# Patient Record
Sex: Female | Born: 1935 | Race: White | Hispanic: No | Smoking: Never smoker
Health system: Southern US, Community
[De-identification: ages and names within clinical notes are randomized; demographics above are authoritative.]

## PROBLEM LIST (undated history)

## (undated) DIAGNOSIS — M1990 Unspecified osteoarthritis, unspecified site: Secondary | ICD-10-CM

## (undated) DIAGNOSIS — I10 Essential (primary) hypertension: Secondary | ICD-10-CM

## (undated) DIAGNOSIS — K219 Gastro-esophageal reflux disease without esophagitis: Secondary | ICD-10-CM

## (undated) DIAGNOSIS — F419 Anxiety disorder, unspecified: Secondary | ICD-10-CM

## (undated) DIAGNOSIS — H04123 Dry eye syndrome of bilateral lacrimal glands: Secondary | ICD-10-CM

## (undated) DIAGNOSIS — H01009 Unspecified blepharitis unspecified eye, unspecified eyelid: Secondary | ICD-10-CM

## (undated) DIAGNOSIS — H269 Unspecified cataract: Secondary | ICD-10-CM

## (undated) HISTORY — PX: CATARACT EXTRACTION: SUR2

---

## 1997-12-06 ENCOUNTER — Ambulatory Visit (HOSPITAL_COMMUNITY): Admission: RE | Admit: 1997-12-06 | Discharge: 1997-12-06 | Payer: Self-pay | Admitting: Obstetrics & Gynecology

## 1998-01-18 ENCOUNTER — Ambulatory Visit (HOSPITAL_COMMUNITY): Admission: RE | Admit: 1998-01-18 | Discharge: 1998-01-18 | Payer: Self-pay | Admitting: Internal Medicine

## 1999-09-04 ENCOUNTER — Encounter: Payer: Self-pay | Admitting: Internal Medicine

## 1999-09-04 ENCOUNTER — Encounter: Admission: RE | Admit: 1999-09-04 | Discharge: 1999-09-04 | Payer: Self-pay | Admitting: Internal Medicine

## 2000-09-05 ENCOUNTER — Encounter: Payer: Self-pay | Admitting: Internal Medicine

## 2000-09-05 ENCOUNTER — Encounter: Admission: RE | Admit: 2000-09-05 | Discharge: 2000-09-05 | Payer: Self-pay | Admitting: Internal Medicine

## 2000-10-03 ENCOUNTER — Other Ambulatory Visit: Admission: RE | Admit: 2000-10-03 | Discharge: 2000-10-03 | Payer: Self-pay | Admitting: Internal Medicine

## 2001-08-06 ENCOUNTER — Encounter: Payer: Self-pay | Admitting: Gastroenterology

## 2001-08-06 ENCOUNTER — Encounter: Admission: RE | Admit: 2001-08-06 | Discharge: 2001-08-06 | Payer: Self-pay | Admitting: Gastroenterology

## 2001-09-07 ENCOUNTER — Encounter: Admission: RE | Admit: 2001-09-07 | Discharge: 2001-09-07 | Payer: Self-pay | Admitting: *Deleted

## 2001-09-07 ENCOUNTER — Encounter: Payer: Self-pay | Admitting: *Deleted

## 2001-11-04 ENCOUNTER — Other Ambulatory Visit: Admission: RE | Admit: 2001-11-04 | Discharge: 2001-11-04 | Payer: Self-pay | Admitting: Internal Medicine

## 2002-09-20 ENCOUNTER — Encounter: Payer: Self-pay | Admitting: Internal Medicine

## 2002-09-20 ENCOUNTER — Encounter: Admission: RE | Admit: 2002-09-20 | Discharge: 2002-09-20 | Payer: Self-pay | Admitting: Internal Medicine

## 2003-09-28 ENCOUNTER — Encounter: Admission: RE | Admit: 2003-09-28 | Discharge: 2003-09-28 | Payer: Self-pay | Admitting: Internal Medicine

## 2004-01-26 ENCOUNTER — Encounter: Admission: RE | Admit: 2004-01-26 | Discharge: 2004-01-26 | Payer: Self-pay | Admitting: Internal Medicine

## 2004-07-04 ENCOUNTER — Ambulatory Visit (HOSPITAL_COMMUNITY): Admission: RE | Admit: 2004-07-04 | Discharge: 2004-07-04 | Payer: Self-pay | Admitting: Gastroenterology

## 2004-10-03 ENCOUNTER — Ambulatory Visit (HOSPITAL_COMMUNITY): Admission: RE | Admit: 2004-10-03 | Discharge: 2004-10-03 | Payer: Self-pay | Admitting: Internal Medicine

## 2005-02-15 ENCOUNTER — Other Ambulatory Visit: Admission: RE | Admit: 2005-02-15 | Discharge: 2005-02-15 | Payer: Self-pay | Admitting: Internal Medicine

## 2005-10-09 ENCOUNTER — Ambulatory Visit (HOSPITAL_COMMUNITY): Admission: RE | Admit: 2005-10-09 | Discharge: 2005-10-09 | Payer: Self-pay | Admitting: Internal Medicine

## 2006-10-15 ENCOUNTER — Encounter: Admission: RE | Admit: 2006-10-15 | Discharge: 2006-10-15 | Payer: Self-pay | Admitting: Internal Medicine

## 2007-10-21 ENCOUNTER — Encounter: Admission: RE | Admit: 2007-10-21 | Discharge: 2007-10-21 | Payer: Self-pay | Admitting: Internal Medicine

## 2008-10-26 ENCOUNTER — Encounter: Admission: RE | Admit: 2008-10-26 | Discharge: 2008-10-26 | Payer: Self-pay | Admitting: Internal Medicine

## 2009-11-01 ENCOUNTER — Encounter: Admission: RE | Admit: 2009-11-01 | Discharge: 2009-11-01 | Payer: Self-pay | Admitting: Internal Medicine

## 2010-10-08 ENCOUNTER — Encounter
Admission: RE | Admit: 2010-10-08 | Discharge: 2010-10-08 | Payer: Self-pay | Source: Home / Self Care | Attending: Internal Medicine | Admitting: Internal Medicine

## 2010-11-14 ENCOUNTER — Other Ambulatory Visit: Payer: Self-pay | Admitting: Internal Medicine

## 2010-11-14 ENCOUNTER — Ambulatory Visit
Admission: RE | Admit: 2010-11-14 | Discharge: 2010-11-14 | Disposition: A | Discharge status: Home / Self Care | Payer: Federal, State, Local not specified - PPO | Source: Ambulatory Visit | Attending: Internal Medicine | Admitting: Internal Medicine

## 2010-11-14 DIAGNOSIS — Z1231 Encounter for screening mammogram for malignant neoplasm of breast: Secondary | ICD-10-CM

## 2011-02-01 NOTE — Op Note (Signed)
NAME:  Selena Farmer, Selena Farmer                 ACCOUNT NO.:  [id number]   MEDICAL RECORD NO.:  [id number]          PATIENT TYPE:  AMB   LOCATION:  ENDO                         FACILITY:  [facility] [place]   PHYSICIAN:  Danise Edge, M.D.   DATE OF BIRTH:  Jan 26, 1924   DATE OF PROCEDURE:  07/04/2004  DATE OF DISCHARGE:                                 OPERATIVE REPORT   PROCEDURE:  Screening colonoscopy.   PROCEDURE INDICATION:  Selena Farmer is a 77 year old female born 1923/09/18.  Selena Farmer is scheduled to undergo her first screening colonoscopy  with polypectomy to prevent colon cancer.   ENDOSCOPIST:  Danise Edge, M.D.   PREMEDICATION:  Versed 6 mg, Demerol 60 mg.   PROCEDURE:  After obtaining informed consent, Selena Farmer was placed in the  left lateral decubitus position.  I administered intravenous Demerol and  intravenous Versed to achieve conscious sedation for the procedure.  The  patient's blood pressure, oxygen saturation, and cardiac rhythm were  monitored throughout the procedure and documented in the medical record.   Anal inspection and digital rectal exam were normal.  The Olympus adjustable  pediatric colonoscope was introduced into the rectum and advanced to the  cecum.  Colonic preparation for the exam today was excellent.   Rectum normal.   Sigmoid colon and descending colon normal.   Splenic flexure normal.   Transverse colon normal.   Hepatic flexure normal.   Ascending colon normal.   Cecum and ileocecal valve normal.   ASSESSMENT:  Normal screening proctocolonoscopy to the cecum.  No endoscopic  evidence for the presence of colorectal neoplasia.      MJ/MEDQ  D:  07/04/2004  T:  07/04/2004  Job:  [id number]   cc:   Georgann Housekeeper, MD  [address]  [place]  [place] [address]  Fax: [phone]

## 2011-10-21 DIAGNOSIS — T1510XA Foreign body in conjunctival sac, unspecified eye, initial encounter: Secondary | ICD-10-CM | POA: Diagnosis not present

## 2011-10-21 DIAGNOSIS — H01009 Unspecified blepharitis unspecified eye, unspecified eyelid: Secondary | ICD-10-CM | POA: Diagnosis not present

## 2011-12-09 ENCOUNTER — Other Ambulatory Visit: Payer: Self-pay | Admitting: Internal Medicine

## 2011-12-09 DIAGNOSIS — Z1231 Encounter for screening mammogram for malignant neoplasm of breast: Secondary | ICD-10-CM

## 2012-01-01 ENCOUNTER — Ambulatory Visit
Admission: RE | Admit: 2012-01-01 | Discharge: 2012-01-01 | Disposition: A | Discharge status: Home / Self Care | Payer: Federal, State, Local not specified - PPO | Source: Ambulatory Visit | Attending: Internal Medicine | Admitting: Internal Medicine

## 2012-01-01 DIAGNOSIS — Z1231 Encounter for screening mammogram for malignant neoplasm of breast: Secondary | ICD-10-CM

## 2012-01-02 DIAGNOSIS — Z961 Presence of intraocular lens: Secondary | ICD-10-CM | POA: Diagnosis not present

## 2012-01-02 DIAGNOSIS — H353190 Nonexudative age-related macular degeneration, unspecified eye, stage unspecified: Secondary | ICD-10-CM | POA: Diagnosis not present

## 2012-06-10 DIAGNOSIS — H01009 Unspecified blepharitis unspecified eye, unspecified eyelid: Secondary | ICD-10-CM | POA: Diagnosis not present

## 2012-06-24 DIAGNOSIS — Z0000 Encounter for general adult medical examination without abnormal findings: Secondary | ICD-10-CM | POA: Diagnosis not present

## 2012-06-24 DIAGNOSIS — R7309 Other abnormal glucose: Secondary | ICD-10-CM | POA: Diagnosis not present

## 2012-06-24 DIAGNOSIS — D649 Anemia, unspecified: Secondary | ICD-10-CM | POA: Diagnosis not present

## 2012-06-24 DIAGNOSIS — I10 Essential (primary) hypertension: Secondary | ICD-10-CM | POA: Diagnosis not present

## 2012-06-24 DIAGNOSIS — E782 Mixed hyperlipidemia: Secondary | ICD-10-CM | POA: Diagnosis not present

## 2012-06-24 DIAGNOSIS — K219 Gastro-esophageal reflux disease without esophagitis: Secondary | ICD-10-CM | POA: Diagnosis not present

## 2012-06-24 DIAGNOSIS — Z23 Encounter for immunization: Secondary | ICD-10-CM | POA: Diagnosis not present

## 2012-06-24 DIAGNOSIS — M810 Age-related osteoporosis without current pathological fracture: Secondary | ICD-10-CM | POA: Diagnosis not present

## 2012-06-24 DIAGNOSIS — Z1331 Encounter for screening for depression: Secondary | ICD-10-CM | POA: Diagnosis not present

## 2012-08-06 DIAGNOSIS — Z961 Presence of intraocular lens: Secondary | ICD-10-CM | POA: Diagnosis not present

## 2012-08-06 DIAGNOSIS — H04129 Dry eye syndrome of unspecified lacrimal gland: Secondary | ICD-10-CM | POA: Diagnosis not present

## 2012-08-06 DIAGNOSIS — H353190 Nonexudative age-related macular degeneration, unspecified eye, stage unspecified: Secondary | ICD-10-CM | POA: Diagnosis not present

## 2012-08-06 DIAGNOSIS — H01009 Unspecified blepharitis unspecified eye, unspecified eyelid: Secondary | ICD-10-CM | POA: Diagnosis not present

## 2012-08-19 DIAGNOSIS — M810 Age-related osteoporosis without current pathological fracture: Secondary | ICD-10-CM | POA: Diagnosis not present

## 2012-11-23 DIAGNOSIS — I10 Essential (primary) hypertension: Secondary | ICD-10-CM | POA: Diagnosis not present

## 2012-11-23 DIAGNOSIS — K589 Irritable bowel syndrome without diarrhea: Secondary | ICD-10-CM | POA: Diagnosis not present

## 2012-11-23 DIAGNOSIS — K219 Gastro-esophageal reflux disease without esophagitis: Secondary | ICD-10-CM | POA: Diagnosis not present

## 2012-12-22 ENCOUNTER — Other Ambulatory Visit: Payer: Self-pay

## 2012-12-22 DIAGNOSIS — Z1231 Encounter for screening mammogram for malignant neoplasm of breast: Secondary | ICD-10-CM

## 2013-01-26 DIAGNOSIS — R040 Epistaxis: Secondary | ICD-10-CM | POA: Diagnosis not present

## 2013-02-03 ENCOUNTER — Ambulatory Visit
Admission: RE | Admit: 2013-02-03 | Discharge: 2013-02-03 | Disposition: A | Discharge status: Home / Self Care | Payer: Medicare Other | Source: Ambulatory Visit

## 2013-02-03 DIAGNOSIS — Z1231 Encounter for screening mammogram for malignant neoplasm of breast: Secondary | ICD-10-CM

## 2013-02-05 ENCOUNTER — Other Ambulatory Visit: Payer: Self-pay

## 2013-02-05 DIAGNOSIS — Z1231 Encounter for screening mammogram for malignant neoplasm of breast: Secondary | ICD-10-CM

## 2013-02-17 DIAGNOSIS — H43819 Vitreous degeneration, unspecified eye: Secondary | ICD-10-CM | POA: Diagnosis not present

## 2013-02-17 DIAGNOSIS — H3530 Unspecified macular degeneration: Secondary | ICD-10-CM | POA: Diagnosis not present

## 2013-02-17 DIAGNOSIS — Z961 Presence of intraocular lens: Secondary | ICD-10-CM | POA: Diagnosis not present

## 2013-06-30 DIAGNOSIS — R7309 Other abnormal glucose: Secondary | ICD-10-CM | POA: Diagnosis not present

## 2013-06-30 DIAGNOSIS — R209 Unspecified disturbances of skin sensation: Secondary | ICD-10-CM | POA: Diagnosis not present

## 2013-06-30 DIAGNOSIS — Z1331 Encounter for screening for depression: Secondary | ICD-10-CM | POA: Diagnosis not present

## 2013-06-30 DIAGNOSIS — I10 Essential (primary) hypertension: Secondary | ICD-10-CM | POA: Diagnosis not present

## 2013-06-30 DIAGNOSIS — Z0000 Encounter for general adult medical examination without abnormal findings: Secondary | ICD-10-CM | POA: Diagnosis not present

## 2013-06-30 DIAGNOSIS — D510 Vitamin B12 deficiency anemia due to intrinsic factor deficiency: Secondary | ICD-10-CM | POA: Diagnosis not present

## 2013-06-30 DIAGNOSIS — E782 Mixed hyperlipidemia: Secondary | ICD-10-CM | POA: Diagnosis not present

## 2013-06-30 DIAGNOSIS — K219 Gastro-esophageal reflux disease without esophagitis: Secondary | ICD-10-CM | POA: Diagnosis not present

## 2013-07-14 DIAGNOSIS — G5600 Carpal tunnel syndrome, unspecified upper limb: Secondary | ICD-10-CM | POA: Diagnosis not present

## 2013-09-21 DIAGNOSIS — J209 Acute bronchitis, unspecified: Secondary | ICD-10-CM | POA: Diagnosis not present

## 2013-09-23 DIAGNOSIS — H04129 Dry eye syndrome of unspecified lacrimal gland: Secondary | ICD-10-CM | POA: Diagnosis not present

## 2013-09-23 DIAGNOSIS — Z961 Presence of intraocular lens: Secondary | ICD-10-CM | POA: Diagnosis not present

## 2013-09-23 DIAGNOSIS — H01009 Unspecified blepharitis unspecified eye, unspecified eyelid: Secondary | ICD-10-CM | POA: Diagnosis not present

## 2013-09-23 DIAGNOSIS — H353190 Nonexudative age-related macular degeneration, unspecified eye, stage unspecified: Secondary | ICD-10-CM | POA: Diagnosis not present

## 2014-01-03 DIAGNOSIS — F411 Generalized anxiety disorder: Secondary | ICD-10-CM | POA: Diagnosis not present

## 2014-01-03 DIAGNOSIS — I10 Essential (primary) hypertension: Secondary | ICD-10-CM | POA: Diagnosis not present

## 2014-01-03 DIAGNOSIS — G5600 Carpal tunnel syndrome, unspecified upper limb: Secondary | ICD-10-CM | POA: Diagnosis not present

## 2014-01-03 DIAGNOSIS — K219 Gastro-esophageal reflux disease without esophagitis: Secondary | ICD-10-CM | POA: Diagnosis not present

## 2014-01-03 DIAGNOSIS — D649 Anemia, unspecified: Secondary | ICD-10-CM | POA: Diagnosis not present

## 2014-01-19 DIAGNOSIS — G5600 Carpal tunnel syndrome, unspecified upper limb: Secondary | ICD-10-CM | POA: Diagnosis not present

## 2014-02-09 ENCOUNTER — Ambulatory Visit: Payer: Medicare Other

## 2014-03-21 DIAGNOSIS — K625 Hemorrhage of anus and rectum: Secondary | ICD-10-CM | POA: Diagnosis not present

## 2014-07-05 DIAGNOSIS — Z13220 Encounter for screening for lipoid disorders: Secondary | ICD-10-CM | POA: Diagnosis not present

## 2014-07-05 DIAGNOSIS — Z23 Encounter for immunization: Secondary | ICD-10-CM | POA: Diagnosis not present

## 2014-07-05 DIAGNOSIS — I10 Essential (primary) hypertension: Secondary | ICD-10-CM | POA: Diagnosis not present

## 2014-07-05 DIAGNOSIS — J309 Allergic rhinitis, unspecified: Secondary | ICD-10-CM | POA: Diagnosis not present

## 2014-07-05 DIAGNOSIS — Z0000 Encounter for general adult medical examination without abnormal findings: Secondary | ICD-10-CM | POA: Diagnosis not present

## 2014-07-05 DIAGNOSIS — D510 Vitamin B12 deficiency anemia due to intrinsic factor deficiency: Secondary | ICD-10-CM | POA: Diagnosis not present

## 2014-07-05 DIAGNOSIS — R7309 Other abnormal glucose: Secondary | ICD-10-CM | POA: Diagnosis not present

## 2014-07-05 DIAGNOSIS — M810 Age-related osteoporosis without current pathological fracture: Secondary | ICD-10-CM | POA: Diagnosis not present

## 2014-07-05 DIAGNOSIS — K219 Gastro-esophageal reflux disease without esophagitis: Secondary | ICD-10-CM | POA: Diagnosis not present

## 2014-07-05 DIAGNOSIS — F419 Anxiety disorder, unspecified: Secondary | ICD-10-CM | POA: Diagnosis not present

## 2014-07-05 DIAGNOSIS — Z1389 Encounter for screening for other disorder: Secondary | ICD-10-CM | POA: Diagnosis not present

## 2014-07-18 DIAGNOSIS — Z1211 Encounter for screening for malignant neoplasm of colon: Secondary | ICD-10-CM | POA: Diagnosis not present

## 2014-07-18 DIAGNOSIS — Z0000 Encounter for general adult medical examination without abnormal findings: Secondary | ICD-10-CM | POA: Diagnosis not present

## 2014-09-02 DIAGNOSIS — L570 Actinic keratosis: Secondary | ICD-10-CM | POA: Diagnosis not present

## 2014-09-02 DIAGNOSIS — L578 Other skin changes due to chronic exposure to nonionizing radiation: Secondary | ICD-10-CM | POA: Diagnosis not present

## 2014-09-02 DIAGNOSIS — L820 Inflamed seborrheic keratosis: Secondary | ICD-10-CM | POA: Diagnosis not present

## 2014-10-06 DIAGNOSIS — H01009 Unspecified blepharitis unspecified eye, unspecified eyelid: Secondary | ICD-10-CM | POA: Diagnosis not present

## 2014-10-06 DIAGNOSIS — Z961 Presence of intraocular lens: Secondary | ICD-10-CM | POA: Diagnosis not present

## 2014-10-06 DIAGNOSIS — H04123 Dry eye syndrome of bilateral lacrimal glands: Secondary | ICD-10-CM | POA: Diagnosis not present

## 2014-10-06 DIAGNOSIS — H3531 Nonexudative age-related macular degeneration: Secondary | ICD-10-CM | POA: Diagnosis not present

## 2015-01-10 DIAGNOSIS — Z1389 Encounter for screening for other disorder: Secondary | ICD-10-CM | POA: Diagnosis not present

## 2015-01-10 DIAGNOSIS — G4700 Insomnia, unspecified: Secondary | ICD-10-CM | POA: Diagnosis not present

## 2015-01-10 DIAGNOSIS — I10 Essential (primary) hypertension: Secondary | ICD-10-CM | POA: Diagnosis not present

## 2015-01-10 DIAGNOSIS — F419 Anxiety disorder, unspecified: Secondary | ICD-10-CM | POA: Diagnosis not present

## 2015-01-10 DIAGNOSIS — M25512 Pain in left shoulder: Secondary | ICD-10-CM | POA: Diagnosis not present

## 2015-01-10 DIAGNOSIS — K219 Gastro-esophageal reflux disease without esophagitis: Secondary | ICD-10-CM | POA: Diagnosis not present

## 2015-05-09 DIAGNOSIS — S76312A Strain of muscle, fascia and tendon of the posterior muscle group at thigh level, left thigh, initial encounter: Secondary | ICD-10-CM | POA: Diagnosis not present

## 2015-05-09 DIAGNOSIS — M5442 Lumbago with sciatica, left side: Secondary | ICD-10-CM | POA: Diagnosis not present

## 2015-05-12 DIAGNOSIS — M5432 Sciatica, left side: Secondary | ICD-10-CM | POA: Diagnosis not present

## 2015-05-12 DIAGNOSIS — M5442 Lumbago with sciatica, left side: Secondary | ICD-10-CM | POA: Diagnosis not present

## 2015-05-15 DIAGNOSIS — M5432 Sciatica, left side: Secondary | ICD-10-CM | POA: Diagnosis not present

## 2015-05-15 DIAGNOSIS — M5442 Lumbago with sciatica, left side: Secondary | ICD-10-CM | POA: Diagnosis not present

## 2015-05-17 DIAGNOSIS — M5442 Lumbago with sciatica, left side: Secondary | ICD-10-CM | POA: Diagnosis not present

## 2015-05-17 DIAGNOSIS — M5432 Sciatica, left side: Secondary | ICD-10-CM | POA: Diagnosis not present

## 2015-05-22 DIAGNOSIS — M5432 Sciatica, left side: Secondary | ICD-10-CM | POA: Diagnosis not present

## 2015-05-22 DIAGNOSIS — M5442 Lumbago with sciatica, left side: Secondary | ICD-10-CM | POA: Diagnosis not present

## 2015-05-24 DIAGNOSIS — M5442 Lumbago with sciatica, left side: Secondary | ICD-10-CM | POA: Diagnosis not present

## 2015-05-24 DIAGNOSIS — M5432 Sciatica, left side: Secondary | ICD-10-CM | POA: Diagnosis not present

## 2015-05-29 DIAGNOSIS — M5432 Sciatica, left side: Secondary | ICD-10-CM | POA: Diagnosis not present

## 2015-05-29 DIAGNOSIS — M5442 Lumbago with sciatica, left side: Secondary | ICD-10-CM | POA: Diagnosis not present

## 2015-06-05 DIAGNOSIS — M5432 Sciatica, left side: Secondary | ICD-10-CM | POA: Diagnosis not present

## 2015-06-05 DIAGNOSIS — M5442 Lumbago with sciatica, left side: Secondary | ICD-10-CM | POA: Diagnosis not present

## 2015-07-20 DIAGNOSIS — Z1389 Encounter for screening for other disorder: Secondary | ICD-10-CM | POA: Diagnosis not present

## 2015-07-20 DIAGNOSIS — Z0000 Encounter for general adult medical examination without abnormal findings: Secondary | ICD-10-CM | POA: Diagnosis not present

## 2015-07-20 DIAGNOSIS — J309 Allergic rhinitis, unspecified: Secondary | ICD-10-CM | POA: Diagnosis not present

## 2015-07-20 DIAGNOSIS — Z23 Encounter for immunization: Secondary | ICD-10-CM | POA: Diagnosis not present

## 2015-07-20 DIAGNOSIS — G4700 Insomnia, unspecified: Secondary | ICD-10-CM | POA: Diagnosis not present

## 2015-07-20 DIAGNOSIS — F419 Anxiety disorder, unspecified: Secondary | ICD-10-CM | POA: Diagnosis not present

## 2015-07-20 DIAGNOSIS — M810 Age-related osteoporosis without current pathological fracture: Secondary | ICD-10-CM | POA: Diagnosis not present

## 2015-07-20 DIAGNOSIS — R7309 Other abnormal glucose: Secondary | ICD-10-CM | POA: Diagnosis not present

## 2015-07-20 DIAGNOSIS — D510 Vitamin B12 deficiency anemia due to intrinsic factor deficiency: Secondary | ICD-10-CM | POA: Diagnosis not present

## 2015-07-20 DIAGNOSIS — I10 Essential (primary) hypertension: Secondary | ICD-10-CM | POA: Diagnosis not present

## 2015-07-20 DIAGNOSIS — K219 Gastro-esophageal reflux disease without esophagitis: Secondary | ICD-10-CM | POA: Diagnosis not present

## 2015-08-04 DIAGNOSIS — L814 Other melanin hyperpigmentation: Secondary | ICD-10-CM | POA: Diagnosis not present

## 2015-08-04 DIAGNOSIS — L578 Other skin changes due to chronic exposure to nonionizing radiation: Secondary | ICD-10-CM | POA: Diagnosis not present

## 2015-08-04 DIAGNOSIS — M7981 Nontraumatic hematoma of soft tissue: Secondary | ICD-10-CM | POA: Diagnosis not present

## 2015-10-23 DIAGNOSIS — H353132 Nonexudative age-related macular degeneration, bilateral, intermediate dry stage: Secondary | ICD-10-CM | POA: Diagnosis not present

## 2015-10-23 DIAGNOSIS — H04123 Dry eye syndrome of bilateral lacrimal glands: Secondary | ICD-10-CM | POA: Diagnosis not present

## 2015-10-23 DIAGNOSIS — Z961 Presence of intraocular lens: Secondary | ICD-10-CM | POA: Diagnosis not present

## 2015-10-23 DIAGNOSIS — Z0100 Encounter for examination of eyes and vision without abnormal findings: Secondary | ICD-10-CM | POA: Diagnosis not present

## 2015-12-12 DIAGNOSIS — M4316 Spondylolisthesis, lumbar region: Secondary | ICD-10-CM | POA: Diagnosis not present

## 2015-12-12 DIAGNOSIS — M5416 Radiculopathy, lumbar region: Secondary | ICD-10-CM | POA: Diagnosis not present

## 2015-12-18 DIAGNOSIS — M5416 Radiculopathy, lumbar region: Secondary | ICD-10-CM | POA: Diagnosis not present

## 2015-12-18 DIAGNOSIS — M4316 Spondylolisthesis, lumbar region: Secondary | ICD-10-CM | POA: Diagnosis not present

## 2016-01-09 DIAGNOSIS — M5416 Radiculopathy, lumbar region: Secondary | ICD-10-CM | POA: Diagnosis not present

## 2016-01-09 DIAGNOSIS — M4316 Spondylolisthesis, lumbar region: Secondary | ICD-10-CM | POA: Diagnosis not present

## 2016-01-25 DIAGNOSIS — J309 Allergic rhinitis, unspecified: Secondary | ICD-10-CM | POA: Diagnosis not present

## 2016-01-25 DIAGNOSIS — I10 Essential (primary) hypertension: Secondary | ICD-10-CM | POA: Diagnosis not present

## 2016-01-25 DIAGNOSIS — K219 Gastro-esophageal reflux disease without esophagitis: Secondary | ICD-10-CM | POA: Diagnosis not present

## 2016-01-25 DIAGNOSIS — K589 Irritable bowel syndrome without diarrhea: Secondary | ICD-10-CM | POA: Diagnosis not present

## 2016-01-25 DIAGNOSIS — F419 Anxiety disorder, unspecified: Secondary | ICD-10-CM | POA: Diagnosis not present

## 2016-06-05 DIAGNOSIS — Z23 Encounter for immunization: Secondary | ICD-10-CM | POA: Diagnosis not present

## 2016-08-01 DIAGNOSIS — M810 Age-related osteoporosis without current pathological fracture: Secondary | ICD-10-CM | POA: Diagnosis not present

## 2016-08-01 DIAGNOSIS — R7309 Other abnormal glucose: Secondary | ICD-10-CM | POA: Diagnosis not present

## 2016-08-01 DIAGNOSIS — D510 Vitamin B12 deficiency anemia due to intrinsic factor deficiency: Secondary | ICD-10-CM | POA: Diagnosis not present

## 2016-08-01 DIAGNOSIS — F419 Anxiety disorder, unspecified: Secondary | ICD-10-CM | POA: Diagnosis not present

## 2016-08-01 DIAGNOSIS — J309 Allergic rhinitis, unspecified: Secondary | ICD-10-CM | POA: Diagnosis not present

## 2016-08-01 DIAGNOSIS — K219 Gastro-esophageal reflux disease without esophagitis: Secondary | ICD-10-CM | POA: Diagnosis not present

## 2016-08-01 DIAGNOSIS — K589 Irritable bowel syndrome without diarrhea: Secondary | ICD-10-CM | POA: Diagnosis not present

## 2016-08-01 DIAGNOSIS — Z0000 Encounter for general adult medical examination without abnormal findings: Secondary | ICD-10-CM | POA: Diagnosis not present

## 2016-08-01 DIAGNOSIS — I10 Essential (primary) hypertension: Secondary | ICD-10-CM | POA: Diagnosis not present

## 2016-08-01 DIAGNOSIS — E7800 Pure hypercholesterolemia, unspecified: Secondary | ICD-10-CM | POA: Diagnosis not present

## 2016-08-01 DIAGNOSIS — Z1389 Encounter for screening for other disorder: Secondary | ICD-10-CM | POA: Diagnosis not present

## 2016-08-01 DIAGNOSIS — G4700 Insomnia, unspecified: Secondary | ICD-10-CM | POA: Diagnosis not present

## 2016-08-06 DIAGNOSIS — Z1211 Encounter for screening for malignant neoplasm of colon: Secondary | ICD-10-CM | POA: Diagnosis not present

## 2016-09-25 DIAGNOSIS — M25531 Pain in right wrist: Secondary | ICD-10-CM | POA: Diagnosis not present

## 2016-09-25 DIAGNOSIS — G5601 Carpal tunnel syndrome, right upper limb: Secondary | ICD-10-CM | POA: Diagnosis not present

## 2016-09-25 DIAGNOSIS — M47812 Spondylosis without myelopathy or radiculopathy, cervical region: Secondary | ICD-10-CM | POA: Diagnosis not present

## 2016-10-08 DIAGNOSIS — G589 Mononeuropathy, unspecified: Secondary | ICD-10-CM | POA: Diagnosis not present

## 2016-10-08 DIAGNOSIS — G5603 Carpal tunnel syndrome, bilateral upper limbs: Secondary | ICD-10-CM | POA: Diagnosis not present

## 2016-10-08 DIAGNOSIS — G5621 Lesion of ulnar nerve, right upper limb: Secondary | ICD-10-CM | POA: Diagnosis not present

## 2016-10-08 DIAGNOSIS — G5601 Carpal tunnel syndrome, right upper limb: Secondary | ICD-10-CM | POA: Diagnosis not present

## 2016-10-09 ENCOUNTER — Other Ambulatory Visit: Payer: Self-pay | Admitting: Orthopedic Surgery

## 2016-10-29 ENCOUNTER — Encounter (HOSPITAL_BASED_OUTPATIENT_CLINIC_OR_DEPARTMENT_OTHER): Payer: Self-pay | Admitting: *Deleted

## 2016-10-31 ENCOUNTER — Ambulatory Visit (HOSPITAL_BASED_OUTPATIENT_CLINIC_OR_DEPARTMENT_OTHER): Payer: Medicare Other | Admitting: Anesthesiology

## 2016-10-31 ENCOUNTER — Encounter (HOSPITAL_BASED_OUTPATIENT_CLINIC_OR_DEPARTMENT_OTHER): Payer: Self-pay

## 2016-10-31 ENCOUNTER — Encounter (HOSPITAL_BASED_OUTPATIENT_CLINIC_OR_DEPARTMENT_OTHER)
Admission: RE | Disposition: A | Discharge status: Home / Self Care | Payer: Self-pay | Source: Ambulatory Visit | Attending: Orthopedic Surgery

## 2016-10-31 ENCOUNTER — Ambulatory Visit (HOSPITAL_BASED_OUTPATIENT_CLINIC_OR_DEPARTMENT_OTHER)
Admission: RE | Admit: 2016-10-31 | Discharge: 2016-10-31 | Disposition: A | Discharge status: Home / Self Care | Payer: Medicare Other | Source: Ambulatory Visit | Attending: Orthopedic Surgery | Admitting: Orthopedic Surgery

## 2016-10-31 DIAGNOSIS — K219 Gastro-esophageal reflux disease without esophagitis: Secondary | ICD-10-CM | POA: Insufficient documentation

## 2016-10-31 DIAGNOSIS — F419 Anxiety disorder, unspecified: Secondary | ICD-10-CM | POA: Insufficient documentation

## 2016-10-31 DIAGNOSIS — Z79899 Other long term (current) drug therapy: Secondary | ICD-10-CM | POA: Diagnosis not present

## 2016-10-31 DIAGNOSIS — G5601 Carpal tunnel syndrome, right upper limb: Secondary | ICD-10-CM | POA: Diagnosis not present

## 2016-10-31 DIAGNOSIS — I10 Essential (primary) hypertension: Secondary | ICD-10-CM | POA: Diagnosis not present

## 2016-10-31 HISTORY — DX: Dry eye syndrome of bilateral lacrimal glands: H04.123

## 2016-10-31 HISTORY — DX: Unspecified osteoarthritis, unspecified site: M19.90

## 2016-10-31 HISTORY — PX: CARPAL TUNNEL RELEASE: SHX101

## 2016-10-31 HISTORY — DX: Gastro-esophageal reflux disease without esophagitis: K21.9

## 2016-10-31 HISTORY — DX: Essential (primary) hypertension: I10

## 2016-10-31 HISTORY — DX: Unspecified blepharitis unspecified eye, unspecified eyelid: H01.009

## 2016-10-31 HISTORY — DX: Unspecified cataract: H26.9

## 2016-10-31 HISTORY — DX: Anxiety disorder, unspecified: F41.9

## 2016-10-31 SURGERY — CARPAL TUNNEL RELEASE
Anesthesia: Regional | Site: Wrist | Laterality: Right

## 2016-10-31 MED ORDER — LIDOCAINE HCL (PF) 0.5 % IJ SOLN
INTRAMUSCULAR | Status: DC | PRN
  Administered 2016-10-31: 30 mL via INTRAVENOUS

## 2016-10-31 MED ORDER — FENTANYL CITRATE (PF) 100 MCG/2ML IJ SOLN: 25.0000 ug | INTRAMUSCULAR | Status: DC | PRN

## 2016-10-31 MED ORDER — HYDROCODONE-ACETAMINOPHEN 5-325 MG PO TABS: ORAL_TABLET | ORAL | 0 refills | Status: DC

## 2016-10-31 MED ORDER — CEFAZOLIN SODIUM-DEXTROSE 2-4 GM/100ML-% IV SOLN
2.0000 g | INTRAVENOUS | Status: AC
  Administered 2016-10-31: 2 g via INTRAVENOUS

## 2016-10-31 MED ORDER — LACTATED RINGERS IV SOLN
INTRAVENOUS | Status: DC
  Administered 2016-10-31: 10:00:00 via INTRAVENOUS

## 2016-10-31 MED ORDER — MIDAZOLAM HCL 2 MG/2ML IJ SOLN: 1.0000 mg | INTRAMUSCULAR | Status: DC | PRN

## 2016-10-31 MED ORDER — ONDANSETRON HCL 4 MG/2ML IJ SOLN
INTRAMUSCULAR | Status: DC | PRN
  Administered 2016-10-31: 4 mg via INTRAVENOUS

## 2016-10-31 MED ORDER — CHLORHEXIDINE GLUCONATE 4 % EX LIQD: 60.0000 mL | Freq: Once | CUTANEOUS | Status: DC

## 2016-10-31 MED ORDER — BUPIVACAINE HCL (PF) 0.25 % IJ SOLN
INTRAMUSCULAR | Status: DC | PRN
  Administered 2016-10-31: 6 mL

## 2016-10-31 MED ORDER — FENTANYL CITRATE (PF) 100 MCG/2ML IJ SOLN
50.0000 ug | INTRAMUSCULAR | Status: DC | PRN
  Administered 2016-10-31: 25 ug via INTRAVENOUS
  Administered 2016-10-31: 50 ug via INTRAVENOUS

## 2016-10-31 MED ORDER — CEFAZOLIN SODIUM-DEXTROSE 2-4 GM/100ML-% IV SOLN
INTRAVENOUS | Status: AC
  Filled 2016-10-31: qty 100

## 2016-10-31 MED ORDER — FENTANYL CITRATE (PF) 100 MCG/2ML IJ SOLN
INTRAMUSCULAR | Status: AC
Start: 2016-10-31 — End: 2016-10-31
  Filled 2016-10-31: qty 2

## 2016-10-31 MED ORDER — SCOPOLAMINE 1 MG/3DAYS TD PT72: 1.0000 | MEDICATED_PATCH | Freq: Once | TRANSDERMAL | Status: DC | PRN

## 2016-10-31 MED ORDER — LIDOCAINE 2% (20 MG/ML) 5 ML SYRINGE
INTRAMUSCULAR | Status: AC
  Filled 2016-10-31: qty 5

## 2016-10-31 SURGICAL SUPPLY — 34 items
BANDAGE ACE 3X5.8 VEL STRL LF (GAUZE/BANDAGES/DRESSINGS) ×3 IMPLANT
BLADE SURG 15 STRL LF DISP TIS (BLADE) ×2 IMPLANT
BLADE SURG 15 STRL SS (BLADE) ×6
BNDG CMPR 9X4 STRL LF SNTH (GAUZE/BANDAGES/DRESSINGS)
BNDG ESMARK 4X9 LF (GAUZE/BANDAGES/DRESSINGS) IMPLANT
BNDG GAUZE ELAST 4 BULKY (GAUZE/BANDAGES/DRESSINGS) ×3 IMPLANT
CHLORAPREP W/TINT 26ML (MISCELLANEOUS) ×3 IMPLANT
CORDS BIPOLAR (ELECTRODE) ×3 IMPLANT
COVER BACK TABLE 60X90IN (DRAPES) ×3 IMPLANT
COVER MAYO STAND STRL (DRAPES) ×3 IMPLANT
CUFF TOURNIQUET SINGLE 18IN (TOURNIQUET CUFF) ×3 IMPLANT
DRAPE EXTREMITY T 121X128X90 (DRAPE) ×3 IMPLANT
DRAPE SURG 17X23 STRL (DRAPES) ×3 IMPLANT
DRSG PAD ABDOMINAL 8X10 ST (GAUZE/BANDAGES/DRESSINGS) ×3 IMPLANT
GAUZE SPONGE 4X4 12PLY STRL (GAUZE/BANDAGES/DRESSINGS) ×3 IMPLANT
GAUZE XEROFORM 1X8 LF (GAUZE/BANDAGES/DRESSINGS) ×3 IMPLANT
GLOVE BIO SURGEON STRL SZ7.5 (GLOVE) ×3 IMPLANT
GLOVE BIOGEL PI IND STRL 8 (GLOVE) ×1 IMPLANT
GLOVE BIOGEL PI INDICATOR 8 (GLOVE) ×2
GOWN STRL REUS W/ TWL LRG LVL3 (GOWN DISPOSABLE) ×1 IMPLANT
GOWN STRL REUS W/TWL LRG LVL3 (GOWN DISPOSABLE) ×3
GOWN STRL REUS W/TWL XL LVL3 (GOWN DISPOSABLE) ×3 IMPLANT
NDL HYPO 25X1 1.5 SAFETY (NEEDLE) ×1 IMPLANT
NEEDLE HYPO 25X1 1.5 SAFETY (NEEDLE) ×3 IMPLANT
NS IRRIG 1000ML POUR BTL (IV SOLUTION) ×3 IMPLANT
PACK BASIN DAY SURGERY FS (CUSTOM PROCEDURE TRAY) ×3 IMPLANT
PADDING CAST ABS 4INX4YD NS (CAST SUPPLIES) ×2
PADDING CAST ABS COTTON 4X4 ST (CAST SUPPLIES) ×1 IMPLANT
STOCKINETTE 4X48 STRL (DRAPES) ×3 IMPLANT
SUT ETHILON 4 0 PS 2 18 (SUTURE) ×3 IMPLANT
SYR BULB 3OZ (MISCELLANEOUS) ×3 IMPLANT
SYR CONTROL 10ML LL (SYRINGE) ×3 IMPLANT
TOWEL OR 17X24 6PK STRL BLUE (TOWEL DISPOSABLE) ×6 IMPLANT
UNDERPAD 30X30 (UNDERPADS AND DIAPERS) ×3 IMPLANT

## 2016-10-31 NOTE — Anesthesia Preprocedure Evaluation (Signed)
Anesthesia Evaluation  Patient identified by MRN, date of birth, ID band Patient awake    Reviewed: Allergy & Precautions, H&P , Patient's Chart, lab work & pertinent test results, reviewed documented beta blocker date and time   Airway Mallampati: II  TM Distance: >3 FB Neck ROM: full    Dental no notable dental hx.    Pulmonary    Pulmonary exam normal breath sounds clear to auscultation       Cardiovascular hypertension,  Rhythm:regular Rate:Normal     Neuro/Psych    GI/Hepatic   Endo/Other    Renal/GU      Musculoskeletal   Abdominal   Peds  Hematology   Anesthesia Other Findings No cardiac meds, exercises daily. She is vigorous and well We'll use bier block No labs needed  Reproductive/Obstetrics                             Anesthesia Physical Anesthesia Plan  ASA: II  Anesthesia Plan: Bier Block   Post-op Pain Management:    Induction: Intravenous  Airway Management Planned: Mask and Natural Airway  Additional Equipment:   Intra-op Plan:   Post-operative Plan:   Informed Consent: I have reviewed the patients History and Physical, chart, labs and discussed the procedure including the risks, benefits and alternatives for the proposed anesthesia with the patient or authorized representative who has indicated his/her understanding and acceptance.   Dental Advisory Given  Plan Discussed with: CRNA and Surgeon  Anesthesia Plan Comments: (Discussed sedation and potential to need to place airway or ETT if warranted by clinical changes intra-operatively. We will start procedure as MAC.)        Anesthesia Quick Evaluation

## 2016-10-31 NOTE — Transfer of Care (Signed)
Immediate Anesthesia Transfer of Care Note  Patient: Selena Farmer  Procedure(s) Performed: Procedure(s): RIGHT CARPAL TUNNEL RELEASE (Right)  Patient Location: PACU  Anesthesia Type:MAC and Bier block  Level of Consciousness: awake and sedated  Airway & Oxygen Therapy: Patient Spontanous Breathing  Post-op Assessment: Report given to RN and Post -op Vital signs reviewed and stable  Post vital signs: Reviewed and stable  Last Vitals:  Vitals:   10/31/16 0950  BP: 134/71  Pulse: 86  Resp: 18  Temp: 36.4 C    Last Pain:  Vitals:   10/31/16 0950  TempSrc: Oral  PainSc: 5       Patients Stated Pain Goal: 3 (54/27/06 2376)  Complications: No apparent anesthesia complications

## 2016-10-31 NOTE — H&P (Signed)
Selena Farmer is an 83 y.o. female.   Chief Complaint: right carpal tunnel syndrome HPI: 83 yo female with carpal tunnel syndrome that wakes her at night.  Positive nerve conduction studies.  She wishes to have a carpal tunnel release for management of the symptoms.  Allergies:  Allergies  Allergen Reactions  . Fexofenadine Shortness Of Breath  . Buspirone Nausea And Vomiting  . Clindamycin/Lincomycin Diarrhea  . Codeine Rash  . Lansoprazole Diarrhea  . Pantoprazole Diarrhea  . Propranolol Diarrhea  . Raloxifene Other (See Comments)    weakness  . Sulfamethoxazole Nausea And Vomiting  . Toprol Xl [Metoprolol Tartrate] Other (See Comments)    Leg cramps    Past Medical History:  Diagnosis Date  . Anxiety   . Arthritis   . Blepharitis   . Cataracts, bilateral   . Dry eyes   . GERD (gastroesophageal reflux disease)   . Hypertension     Past Surgical History:  Procedure Laterality Date  . CATARACT EXTRACTION      Family History: History reviewed. No pertinent family history.  Social History:   reports that she has never smoked. She has never used smokeless tobacco. She reports that she drinks alcohol. She reports that she does not use drugs.  Medications: Medications Prior to Admission  Medication Sig Dispense Refill  . beta carotene 30 MG capsule Take 30 mg by mouth daily.    . calcium carbonate 1250 MG capsule Take 1,250 mg by mouth 2 (two) times daily with a meal.    . chlordiazePOXIDE (LIBRIUM) 5 MG capsule Take 5 mg by mouth 3 (three) times daily as needed for anxiety.    . cholecalciferol (VITAMIN D) 1000 units tablet Take 1,000 Units by mouth daily.    . Cranberry 125 MG TABS Take by mouth.    . ferrous sulfate 325 (65 FE) MG tablet Take 325 mg by mouth daily with breakfast.    . glucosamine-chondroitin 500-400 MG tablet Take 1 tablet by mouth 3 (three) times daily.    . Misc Natural Products (LUTEIN 20 PO) Take by mouth.    . Multiple Vitamins-Minerals  (PRESERVISION AREDS 2+MULTI VIT PO) Take by mouth.    . nizatidine (AXID) 150 MG capsule Take 150 mg by mouth 2 (two) times daily.    . Omega-3 Fatty Acids (FISH OIL PO) Take by mouth.    Marland Kitchen omeprazole (PRILOSEC) 20 MG capsule Take 20 mg by mouth daily.    Vladimir Faster Glycol-Propyl Glycol 0.4-0.3 % SOLN Apply to eye.    . ramipril (ALTACE) 10 MG capsule Take 10 mg by mouth daily.    Marland Kitchen thiamine (VITAMIN B-1) 100 MG tablet Take 100 mg by mouth daily.    . vitamin B-12 (CYANOCOBALAMIN) 1000 MCG tablet Take 1,000 mcg by mouth daily.      No results found for this or any previous visit (from the past 48 hour(s)).  No results found.   A comprehensive review of systems was negative.  Blood pressure 134/71, pulse 86, temperature 97.6 F (36.4 C), temperature source Oral, resp. rate 18, height 4\' 10"  (1.473 m), weight 52.6 kg (116 lb), SpO2 99 %.  General appearance: alert, cooperative and appears stated age Head: Normocephalic, without obvious abnormality, atraumatic Neck: supple, symmetrical, trachea midline Resp: clear to auscultation bilaterally Cardio: regular rate and rhythm GI: non-tender Extremities: Intact sensation and capillary refill all digits.  +epl/fpl/io.  No wounds.  Pulses: 2+ and symmetric Skin: Skin color, texture, turgor normal. No  rashes or lesions Neurologic: Grossly normal Incision/Wound:none  Assessment/Plan Right carpal tunnel syndrome.  Non operative and operative treatment options were discussed with the patient and patient wishes to proceed with operative treatment. Risks, benefits, and alternatives of surgery were discussed and the patient agrees with the plan of care.   Ron Junco R 10/31/2016, 10:56 AM

## 2016-10-31 NOTE — Brief Op Note (Signed)
10/31/2016  11:33 AM  PATIENT:  Selena Farmer  83 y.o. female  PRE-OPERATIVE DIAGNOSIS:  RIGHT CARPAL TUNNEL SYNDROME  POST-OPERATIVE DIAGNOSIS:  RIGHT CARPAL TUNNEL SYNDROME  PROCEDURE:  Procedure(s): RIGHT CARPAL TUNNEL RELEASE (Right)  SURGEON:  Surgeon(s) and Role:    * Leanora Cover, MD - Primary  PHYSICIAN ASSISTANT:   ASSISTANTS: none   ANESTHESIA:   Bier block  EBL:  Total I/O In: 400 [I.V.:400] Out: 2 [Blood:2]  BLOOD ADMINISTERED:none  DRAINS: none   LOCAL MEDICATIONS USED:  MARCAINE     SPECIMEN:  No Specimen  DISPOSITION OF SPECIMEN:  N/A  COUNTS:  YES  TOURNIQUET:   Total Tourniquet Time Documented: Forearm (Right) - 21 minutes Total: Forearm (Right) - 21 minutes   DICTATION: .Note written in EPIC  PLAN OF CARE: Discharge to home after PACU  PATIENT DISPOSITION:  PACU - hemodynamically stable.

## 2016-10-31 NOTE — Anesthesia Postprocedure Evaluation (Signed)
Anesthesia Post Note  Patient: Selena Farmer  Procedure(s) Performed: Procedure(s) (LRB): RIGHT CARPAL TUNNEL RELEASE (Right)  Patient location during evaluation: PACU Anesthesia Type: Bier Block Level of consciousness: awake and alert and oriented Pain management: pain level controlled Vital Signs Assessment: post-procedure vital signs reviewed and stable Respiratory status: nonlabored ventilation, spontaneous breathing and respiratory function stable Cardiovascular status: blood pressure returned to baseline and stable Postop Assessment: no signs of nausea or vomiting Anesthetic complications: no       Last Vitals:  Vitals:   10/31/16 1134 10/31/16 1145  BP: (!) 136/58 (!) 125/57  Pulse:  72  Resp: 10 12  Temp: 36.4 C     Last Pain:  Vitals:   10/31/16 0950  TempSrc: Oral  PainSc: 5                  Bobie Caris A.

## 2016-10-31 NOTE — Op Note (Signed)
10/31/2016 [facility]                              OPERATIVE REPORT   PREOPERATIVE DIAGNOSIS:  Right carpal tunnel syndrome.  POSTOPERATIVE DIAGNOSIS:  Right carpal tunnel syndrome.  PROCEDURE:  Right carpal tunnel release.  SURGEON:  Leanora Cover, MD  ASSISTANT:  none.  ANESTHESIA:  Bier block.  IV FLUIDS:  Per anesthesia flow sheet.  ESTIMATED BLOOD LOSS:  Minimal.  COMPLICATIONS:  None.  SPECIMENS:  None.  TOURNIQUET TIME:    Total Tourniquet Time Documented: Forearm (Right) - 21 minutes Total: Forearm (Right) - 21 minutes   DISPOSITION:  Stable to PACU.  LOCATION: [facility]  INDICATIONS:  83 yo female with carpal tunnel syndrome.  This wakes her at night.  Positive nerve conduction studies. She wishes to have a carpal tunnel release for management of her symptoms.  Risks, benefits and alternatives of surgery were discussed including the risk of blood loss; infection; damage to nerves, vessels, tendons, ligaments, bone; failure of surgery; need for additional surgery; complications with wound healing; continued pain; recurrence of carpal tunnel syndrome; and damage to motor branch. She voiced understanding of these risks and elected to proceed.   OPERATIVE COURSE:  After being identified preoperatively by myself, the patient and I agreed upon the procedure and site of procedure.  The surgical site was marked.  The risks, benefits, and alternatives of the surgery were reviewed and she wished to proceed.  Surgical consent had been signed.  She was given IV Ancef as preoperative antibiotic prophylaxis.  She was transferred to the operating room and placed on the operating room table in supine position with the Right upper extremity on an armboard.  Bier block was induced by Anesthesiology.  Right upper extremity was prepped and draped in normal sterile orthopaedic fashion.  A surgical pause was performed between the surgeons, anesthesia, and  operating room staff, and all were in agreement as to the patient, procedure, and site of procedure.  Tourniquet at the proximal aspect of the forearm had been inflated for the Bier block.  Incision was made over the transverse carpal ligament and carried into the subcutaneous tissues by spreading technique.  Bipolar electrocautery was used to obtain hemostasis.  The palmar fascia was sharply incised.  The transverse carpal ligament was identified and sharply incised.  It was incised distally first.  Care was taken to ensure complete decompression distally.  It was then incised proximally.  Scissors were used to split the distal aspect of the volar antebrachial fascia.  A finger was placed into the wound to ensure complete decompression, which was the case.  The nerve was examined.  It was adherent to the radial leaflet.  The motor branch was identified and was intact.  The wound was copiously irrigated with sterile saline.  It was then closed with 4-0 nylon in a horizontal mattress fashion.  It was injected with 6 mL of 0.25% plain Marcaine to aid in postoperative analgesia.  It was dressed with sterile Xeroform, 4x4s, an ABD, and wrapped with Kerlix and an Ace bandage.  Tourniquet was deflated at 21 minutes.  Fingertips were pink with brisk capillary refill after deflation of the tourniquet.  Operative drapes were broken down.  The patient was awoken from anesthesia safely.  She was transferred back to stretcher and taken to the PACU in stable condition.  I will see her back  in the office in 1 week for postoperative followup.  I will give her a prescription for norco 5/325 1-2 tabs PO q6 hours prn pain, dispense #20.    Tennis Must, MD Electronically signed, 10/31/16

## 2016-10-31 NOTE — Discharge Instructions (Addendum)
[facility] Instructions °Hand Surgery ° °Wound Care: °Keep your hand elevated above the level of your heart.  Do not allow it to dangle by your side.  Keep the dressing dry and do not remove it unless your doctor advises you to do so.  He will usually change it at the time of your post-op visit.  Moving your fingers is advised to stimulate circulation but will depend on the site of your surgery.  If you have a splint applied, your doctor will advise you regarding movement. ° °Activity: °Do not drive or operate machinery today.  Rest today and then you may return to your normal activity and work as indicated by your physician. ° °Diet:  °Drink liquids today or eat a light diet.  You may resume a regular diet tomorrow.   ° °General expectations: °Pain for two to three days. °Fingers may become slightly swollen. ° °Call your doctor if any of the following occur: °Severe pain not relieved by pain medication. °Elevated temperature. °Dressing soaked with blood. °Inability to move fingers. °White or bluish color to fingers. ° °Regional Anesthesia Blocks ° °1. Numbness or the inability to move the "blocked" extremity may last from 3-48 hours after placement. The length of time depends on the medication injected and your individual response to the medication. If the numbness is not going away after 48 hours, call your surgeon. ° °2. The extremity that is blocked will need to be protected until the numbness is gone and the  Strength has returned. Because you cannot feel it, you will need to take extra care to avoid injury. Because it may be weak, you may have difficulty moving it or using it. You may not know what position it is in without looking at it while the block is in effect. ° °3. For blocks in the legs and feet, returning to weight bearing and walking needs to be done carefully. You will need to wait until the numbness is entirely gone and the strength has returned. You should be able to move your leg and foot  normally before you try and bear weight or walk. You will need someone to be with you when you first try to ensure you do not fall and possibly risk injury. ° °4. Bruising and tenderness at the needle site are common side effects and will resolve in a few days. ° °5. Persistent numbness or new problems with movement should be communicated to the surgeon or the [facility] ([phone])/ [facility] ([phone]). ° °

## 2016-11-01 ENCOUNTER — Encounter (HOSPITAL_BASED_OUTPATIENT_CLINIC_OR_DEPARTMENT_OTHER): Payer: Self-pay | Admitting: Orthopedic Surgery

## 2016-12-03 DIAGNOSIS — H01002 Unspecified blepharitis right lower eyelid: Secondary | ICD-10-CM | POA: Diagnosis not present

## 2016-12-03 DIAGNOSIS — H04121 Dry eye syndrome of right lacrimal gland: Secondary | ICD-10-CM | POA: Diagnosis not present

## 2016-12-03 DIAGNOSIS — H01001 Unspecified blepharitis right upper eyelid: Secondary | ICD-10-CM | POA: Diagnosis not present

## 2017-01-15 DIAGNOSIS — H353132 Nonexudative age-related macular degeneration, bilateral, intermediate dry stage: Secondary | ICD-10-CM | POA: Diagnosis not present

## 2017-01-15 DIAGNOSIS — Z961 Presence of intraocular lens: Secondary | ICD-10-CM | POA: Diagnosis not present

## 2017-02-06 DIAGNOSIS — I10 Essential (primary) hypertension: Secondary | ICD-10-CM | POA: Diagnosis not present

## 2017-02-06 DIAGNOSIS — F419 Anxiety disorder, unspecified: Secondary | ICD-10-CM | POA: Diagnosis not present

## 2017-02-06 DIAGNOSIS — K589 Irritable bowel syndrome without diarrhea: Secondary | ICD-10-CM | POA: Diagnosis not present

## 2017-02-06 DIAGNOSIS — K219 Gastro-esophageal reflux disease without esophagitis: Secondary | ICD-10-CM | POA: Diagnosis not present

## 2017-03-25 DIAGNOSIS — H02052 Trichiasis without entropian right lower eyelid: Secondary | ICD-10-CM | POA: Diagnosis not present

## 2017-03-25 DIAGNOSIS — H353132 Nonexudative age-related macular degeneration, bilateral, intermediate dry stage: Secondary | ICD-10-CM | POA: Diagnosis not present

## 2017-07-01 DIAGNOSIS — D1801 Hemangioma of skin and subcutaneous tissue: Secondary | ICD-10-CM | POA: Diagnosis not present

## 2017-07-01 DIAGNOSIS — L814 Other melanin hyperpigmentation: Secondary | ICD-10-CM | POA: Diagnosis not present

## 2017-07-01 DIAGNOSIS — L821 Other seborrheic keratosis: Secondary | ICD-10-CM | POA: Diagnosis not present

## 2017-07-22 DIAGNOSIS — F419 Anxiety disorder, unspecified: Secondary | ICD-10-CM | POA: Diagnosis not present

## 2017-07-22 DIAGNOSIS — M179 Osteoarthritis of knee, unspecified: Secondary | ICD-10-CM | POA: Diagnosis not present

## 2017-07-22 DIAGNOSIS — M519 Unspecified thoracic, thoracolumbar and lumbosacral intervertebral disc disorder: Secondary | ICD-10-CM | POA: Diagnosis not present

## 2017-07-22 DIAGNOSIS — M810 Age-related osteoporosis without current pathological fracture: Secondary | ICD-10-CM | POA: Diagnosis not present

## 2017-07-22 DIAGNOSIS — R7309 Other abnormal glucose: Secondary | ICD-10-CM | POA: Diagnosis not present

## 2017-07-22 DIAGNOSIS — Z0000 Encounter for general adult medical examination without abnormal findings: Secondary | ICD-10-CM | POA: Diagnosis not present

## 2017-07-22 DIAGNOSIS — J309 Allergic rhinitis, unspecified: Secondary | ICD-10-CM | POA: Diagnosis not present

## 2017-07-22 DIAGNOSIS — Z1389 Encounter for screening for other disorder: Secondary | ICD-10-CM | POA: Diagnosis not present

## 2017-07-22 DIAGNOSIS — E7800 Pure hypercholesterolemia, unspecified: Secondary | ICD-10-CM | POA: Diagnosis not present

## 2017-07-22 DIAGNOSIS — K219 Gastro-esophageal reflux disease without esophagitis: Secondary | ICD-10-CM | POA: Diagnosis not present

## 2017-07-22 DIAGNOSIS — I10 Essential (primary) hypertension: Secondary | ICD-10-CM | POA: Diagnosis not present

## 2017-07-22 DIAGNOSIS — D510 Vitamin B12 deficiency anemia due to intrinsic factor deficiency: Secondary | ICD-10-CM | POA: Diagnosis not present

## 2017-08-01 DIAGNOSIS — M48061 Spinal stenosis, lumbar region without neurogenic claudication: Secondary | ICD-10-CM | POA: Diagnosis not present

## 2017-08-01 DIAGNOSIS — M1711 Unilateral primary osteoarthritis, right knee: Secondary | ICD-10-CM | POA: Diagnosis not present

## 2017-10-22 DIAGNOSIS — Z1389 Encounter for screening for other disorder: Secondary | ICD-10-CM | POA: Diagnosis not present

## 2017-10-22 DIAGNOSIS — R142 Eructation: Secondary | ICD-10-CM | POA: Diagnosis not present

## 2017-10-22 DIAGNOSIS — I10 Essential (primary) hypertension: Secondary | ICD-10-CM | POA: Diagnosis not present

## 2017-10-22 DIAGNOSIS — F419 Anxiety disorder, unspecified: Secondary | ICD-10-CM | POA: Diagnosis not present

## 2017-10-22 DIAGNOSIS — K219 Gastro-esophageal reflux disease without esophagitis: Secondary | ICD-10-CM | POA: Diagnosis not present

## 2017-10-22 DIAGNOSIS — M810 Age-related osteoporosis without current pathological fracture: Secondary | ICD-10-CM | POA: Diagnosis not present

## 2017-10-22 DIAGNOSIS — D510 Vitamin B12 deficiency anemia due to intrinsic factor deficiency: Secondary | ICD-10-CM | POA: Diagnosis not present

## 2017-10-22 DIAGNOSIS — M519 Unspecified thoracic, thoracolumbar and lumbosacral intervertebral disc disorder: Secondary | ICD-10-CM | POA: Diagnosis not present

## 2017-10-29 DIAGNOSIS — M1388 Other specified arthritis, other site: Secondary | ICD-10-CM | POA: Diagnosis not present

## 2017-10-29 DIAGNOSIS — M810 Age-related osteoporosis without current pathological fracture: Secondary | ICD-10-CM | POA: Diagnosis not present

## 2017-10-29 DIAGNOSIS — M545 Low back pain: Secondary | ICD-10-CM | POA: Diagnosis not present

## 2017-11-04 DIAGNOSIS — M810 Age-related osteoporosis without current pathological fracture: Secondary | ICD-10-CM | POA: Diagnosis not present

## 2017-11-04 DIAGNOSIS — M545 Low back pain: Secondary | ICD-10-CM | POA: Diagnosis not present

## 2017-11-04 DIAGNOSIS — M1388 Other specified arthritis, other site: Secondary | ICD-10-CM | POA: Diagnosis not present

## 2017-11-13 DIAGNOSIS — M545 Low back pain: Secondary | ICD-10-CM | POA: Diagnosis not present

## 2017-11-13 DIAGNOSIS — M1388 Other specified arthritis, other site: Secondary | ICD-10-CM | POA: Diagnosis not present

## 2017-11-13 DIAGNOSIS — M810 Age-related osteoporosis without current pathological fracture: Secondary | ICD-10-CM | POA: Diagnosis not present

## 2017-11-20 DIAGNOSIS — M810 Age-related osteoporosis without current pathological fracture: Secondary | ICD-10-CM | POA: Diagnosis not present

## 2017-11-20 DIAGNOSIS — M545 Low back pain: Secondary | ICD-10-CM | POA: Diagnosis not present

## 2017-11-20 DIAGNOSIS — M1388 Other specified arthritis, other site: Secondary | ICD-10-CM | POA: Diagnosis not present

## 2017-12-16 DIAGNOSIS — M810 Age-related osteoporosis without current pathological fracture: Secondary | ICD-10-CM | POA: Diagnosis not present

## 2018-02-06 DIAGNOSIS — M810 Age-related osteoporosis without current pathological fracture: Secondary | ICD-10-CM | POA: Diagnosis not present

## 2018-02-06 DIAGNOSIS — M519 Unspecified thoracic, thoracolumbar and lumbosacral intervertebral disc disorder: Secondary | ICD-10-CM | POA: Diagnosis not present

## 2018-02-06 DIAGNOSIS — I10 Essential (primary) hypertension: Secondary | ICD-10-CM | POA: Diagnosis not present

## 2018-02-06 DIAGNOSIS — J309 Allergic rhinitis, unspecified: Secondary | ICD-10-CM | POA: Diagnosis not present

## 2018-02-06 DIAGNOSIS — K219 Gastro-esophageal reflux disease without esophagitis: Secondary | ICD-10-CM | POA: Diagnosis not present

## 2018-02-06 DIAGNOSIS — R42 Dizziness and giddiness: Secondary | ICD-10-CM | POA: Diagnosis not present

## 2018-02-06 DIAGNOSIS — G5600 Carpal tunnel syndrome, unspecified upper limb: Secondary | ICD-10-CM | POA: Diagnosis not present

## 2018-02-24 DIAGNOSIS — J069 Acute upper respiratory infection, unspecified: Secondary | ICD-10-CM | POA: Diagnosis not present

## 2018-02-27 DIAGNOSIS — H353132 Nonexudative age-related macular degeneration, bilateral, intermediate dry stage: Secondary | ICD-10-CM | POA: Diagnosis not present

## 2018-03-16 DIAGNOSIS — H40043 Steroid responder, bilateral: Secondary | ICD-10-CM | POA: Diagnosis not present

## 2018-03-16 DIAGNOSIS — H52223 Regular astigmatism, bilateral: Secondary | ICD-10-CM | POA: Diagnosis not present

## 2018-03-16 DIAGNOSIS — H5203 Hypermetropia, bilateral: Secondary | ICD-10-CM | POA: Diagnosis not present

## 2018-03-16 DIAGNOSIS — H16223 Keratoconjunctivitis sicca, not specified as Sjogren's, bilateral: Secondary | ICD-10-CM | POA: Diagnosis not present

## 2018-03-23 DIAGNOSIS — H40043 Steroid responder, bilateral: Secondary | ICD-10-CM | POA: Diagnosis not present

## 2018-04-07 DIAGNOSIS — H40053 Ocular hypertension, bilateral: Secondary | ICD-10-CM | POA: Diagnosis not present

## 2018-04-07 DIAGNOSIS — H04123 Dry eye syndrome of bilateral lacrimal glands: Secondary | ICD-10-CM | POA: Diagnosis not present

## 2018-04-07 DIAGNOSIS — H40043 Steroid responder, bilateral: Secondary | ICD-10-CM | POA: Diagnosis not present

## 2018-04-30 DIAGNOSIS — H401132 Primary open-angle glaucoma, bilateral, moderate stage: Secondary | ICD-10-CM | POA: Diagnosis not present

## 2018-05-29 DIAGNOSIS — H401134 Primary open-angle glaucoma, bilateral, indeterminate stage: Secondary | ICD-10-CM | POA: Diagnosis not present

## 2018-07-14 DIAGNOSIS — Z23 Encounter for immunization: Secondary | ICD-10-CM | POA: Diagnosis not present

## 2018-08-04 DIAGNOSIS — Z1389 Encounter for screening for other disorder: Secondary | ICD-10-CM | POA: Diagnosis not present

## 2018-08-04 DIAGNOSIS — J309 Allergic rhinitis, unspecified: Secondary | ICD-10-CM | POA: Diagnosis not present

## 2018-08-04 DIAGNOSIS — K219 Gastro-esophageal reflux disease without esophagitis: Secondary | ICD-10-CM | POA: Diagnosis not present

## 2018-08-04 DIAGNOSIS — M810 Age-related osteoporosis without current pathological fracture: Secondary | ICD-10-CM | POA: Diagnosis not present

## 2018-08-04 DIAGNOSIS — E7800 Pure hypercholesterolemia, unspecified: Secondary | ICD-10-CM | POA: Diagnosis not present

## 2018-08-04 DIAGNOSIS — Z0000 Encounter for general adult medical examination without abnormal findings: Secondary | ICD-10-CM | POA: Diagnosis not present

## 2018-08-04 DIAGNOSIS — I10 Essential (primary) hypertension: Secondary | ICD-10-CM | POA: Diagnosis not present

## 2018-08-04 DIAGNOSIS — M519 Unspecified thoracic, thoracolumbar and lumbosacral intervertebral disc disorder: Secondary | ICD-10-CM | POA: Diagnosis not present

## 2018-08-04 DIAGNOSIS — M179 Osteoarthritis of knee, unspecified: Secondary | ICD-10-CM | POA: Diagnosis not present

## 2018-08-04 DIAGNOSIS — G5600 Carpal tunnel syndrome, unspecified upper limb: Secondary | ICD-10-CM | POA: Diagnosis not present

## 2018-08-04 DIAGNOSIS — R7309 Other abnormal glucose: Secondary | ICD-10-CM | POA: Diagnosis not present

## 2018-08-04 DIAGNOSIS — D510 Vitamin B12 deficiency anemia due to intrinsic factor deficiency: Secondary | ICD-10-CM | POA: Diagnosis not present

## 2018-09-03 DIAGNOSIS — M170 Bilateral primary osteoarthritis of knee: Secondary | ICD-10-CM | POA: Diagnosis not present

## 2018-10-08 DIAGNOSIS — H401134 Primary open-angle glaucoma, bilateral, indeterminate stage: Secondary | ICD-10-CM | POA: Diagnosis not present

## 2018-10-29 DIAGNOSIS — R197 Diarrhea, unspecified: Secondary | ICD-10-CM | POA: Diagnosis not present

## 2018-10-29 DIAGNOSIS — K589 Irritable bowel syndrome without diarrhea: Secondary | ICD-10-CM | POA: Diagnosis not present

## 2018-11-20 DIAGNOSIS — K591 Functional diarrhea: Secondary | ICD-10-CM | POA: Diagnosis not present

## 2019-02-09 DIAGNOSIS — M519 Unspecified thoracic, thoracolumbar and lumbosacral intervertebral disc disorder: Secondary | ICD-10-CM | POA: Diagnosis not present

## 2019-02-09 DIAGNOSIS — F419 Anxiety disorder, unspecified: Secondary | ICD-10-CM | POA: Diagnosis not present

## 2019-02-09 DIAGNOSIS — K219 Gastro-esophageal reflux disease without esophagitis: Secondary | ICD-10-CM | POA: Diagnosis not present

## 2019-02-09 DIAGNOSIS — I10 Essential (primary) hypertension: Secondary | ICD-10-CM | POA: Diagnosis not present

## 2019-02-09 DIAGNOSIS — M179 Osteoarthritis of knee, unspecified: Secondary | ICD-10-CM | POA: Diagnosis not present

## 2019-02-09 DIAGNOSIS — D510 Vitamin B12 deficiency anemia due to intrinsic factor deficiency: Secondary | ICD-10-CM | POA: Diagnosis not present

## 2019-03-09 DIAGNOSIS — F419 Anxiety disorder, unspecified: Secondary | ICD-10-CM | POA: Diagnosis not present

## 2019-03-09 DIAGNOSIS — K219 Gastro-esophageal reflux disease without esophagitis: Secondary | ICD-10-CM | POA: Diagnosis not present

## 2019-03-09 DIAGNOSIS — K589 Irritable bowel syndrome without diarrhea: Secondary | ICD-10-CM | POA: Diagnosis not present

## 2019-03-09 DIAGNOSIS — I10 Essential (primary) hypertension: Secondary | ICD-10-CM | POA: Diagnosis not present

## 2019-05-17 DIAGNOSIS — H353132 Nonexudative age-related macular degeneration, bilateral, intermediate dry stage: Secondary | ICD-10-CM | POA: Diagnosis not present

## 2019-06-01 DIAGNOSIS — H35033 Hypertensive retinopathy, bilateral: Secondary | ICD-10-CM | POA: Diagnosis not present

## 2019-06-01 DIAGNOSIS — H35373 Puckering of macula, bilateral: Secondary | ICD-10-CM | POA: Diagnosis not present

## 2019-06-01 DIAGNOSIS — H353132 Nonexudative age-related macular degeneration, bilateral, intermediate dry stage: Secondary | ICD-10-CM | POA: Diagnosis not present

## 2019-06-01 DIAGNOSIS — H43811 Vitreous degeneration, right eye: Secondary | ICD-10-CM | POA: Diagnosis not present

## 2019-06-11 DIAGNOSIS — M170 Bilateral primary osteoarthritis of knee: Secondary | ICD-10-CM | POA: Diagnosis not present

## 2019-06-14 DIAGNOSIS — R413 Other amnesia: Secondary | ICD-10-CM | POA: Diagnosis not present

## 2019-06-14 DIAGNOSIS — M179 Osteoarthritis of knee, unspecified: Secondary | ICD-10-CM | POA: Diagnosis not present

## 2019-06-14 DIAGNOSIS — I10 Essential (primary) hypertension: Secondary | ICD-10-CM | POA: Diagnosis not present

## 2019-06-14 DIAGNOSIS — F419 Anxiety disorder, unspecified: Secondary | ICD-10-CM | POA: Diagnosis not present

## 2019-06-14 DIAGNOSIS — M519 Unspecified thoracic, thoracolumbar and lumbosacral intervertebral disc disorder: Secondary | ICD-10-CM | POA: Diagnosis not present

## 2019-06-14 DIAGNOSIS — K589 Irritable bowel syndrome without diarrhea: Secondary | ICD-10-CM | POA: Diagnosis not present

## 2019-06-14 DIAGNOSIS — K219 Gastro-esophageal reflux disease without esophagitis: Secondary | ICD-10-CM | POA: Diagnosis not present

## 2019-06-14 DIAGNOSIS — Z23 Encounter for immunization: Secondary | ICD-10-CM | POA: Diagnosis not present

## 2019-07-01 DIAGNOSIS — H04123 Dry eye syndrome of bilateral lacrimal glands: Secondary | ICD-10-CM | POA: Diagnosis not present

## 2019-10-19 DIAGNOSIS — Z23 Encounter for immunization: Secondary | ICD-10-CM | POA: Diagnosis not present

## 2019-10-29 DIAGNOSIS — M545 Low back pain: Secondary | ICD-10-CM | POA: Diagnosis not present

## 2019-11-16 DIAGNOSIS — Z23 Encounter for immunization: Secondary | ICD-10-CM | POA: Diagnosis not present

## 2019-11-30 DIAGNOSIS — H353132 Nonexudative age-related macular degeneration, bilateral, intermediate dry stage: Secondary | ICD-10-CM | POA: Diagnosis not present

## 2019-11-30 DIAGNOSIS — H35373 Puckering of macula, bilateral: Secondary | ICD-10-CM | POA: Diagnosis not present

## 2019-11-30 DIAGNOSIS — H43811 Vitreous degeneration, right eye: Secondary | ICD-10-CM | POA: Diagnosis not present

## 2019-11-30 DIAGNOSIS — H35033 Hypertensive retinopathy, bilateral: Secondary | ICD-10-CM | POA: Diagnosis not present

## 2019-12-30 DIAGNOSIS — H401131 Primary open-angle glaucoma, bilateral, mild stage: Secondary | ICD-10-CM | POA: Diagnosis not present

## 2020-01-03 DIAGNOSIS — R350 Frequency of micturition: Secondary | ICD-10-CM | POA: Diagnosis not present

## 2020-01-03 DIAGNOSIS — R32 Unspecified urinary incontinence: Secondary | ICD-10-CM | POA: Diagnosis not present

## 2020-01-03 DIAGNOSIS — I10 Essential (primary) hypertension: Secondary | ICD-10-CM | POA: Diagnosis not present

## 2020-01-04 DIAGNOSIS — M533 Sacrococcygeal disorders, not elsewhere classified: Secondary | ICD-10-CM | POA: Diagnosis not present

## 2020-01-05 DIAGNOSIS — N390 Urinary tract infection, site not specified: Secondary | ICD-10-CM | POA: Diagnosis not present

## 2020-01-05 DIAGNOSIS — B372 Candidiasis of skin and nail: Secondary | ICD-10-CM | POA: Diagnosis not present

## 2020-01-05 DIAGNOSIS — I10 Essential (primary) hypertension: Secondary | ICD-10-CM | POA: Diagnosis not present

## 2020-01-05 DIAGNOSIS — B373 Candidiasis of vulva and vagina: Secondary | ICD-10-CM | POA: Diagnosis not present

## 2020-02-08 DIAGNOSIS — M533 Sacrococcygeal disorders, not elsewhere classified: Secondary | ICD-10-CM | POA: Diagnosis not present

## 2020-02-11 DIAGNOSIS — Z0000 Encounter for general adult medical examination without abnormal findings: Secondary | ICD-10-CM | POA: Diagnosis not present

## 2020-02-11 DIAGNOSIS — Z1389 Encounter for screening for other disorder: Secondary | ICD-10-CM | POA: Diagnosis not present

## 2020-03-27 DIAGNOSIS — R600 Localized edema: Secondary | ICD-10-CM | POA: Diagnosis not present

## 2020-04-05 DIAGNOSIS — M533 Sacrococcygeal disorders, not elsewhere classified: Secondary | ICD-10-CM | POA: Diagnosis not present

## 2020-07-06 DIAGNOSIS — M5432 Sciatica, left side: Secondary | ICD-10-CM | POA: Diagnosis not present

## 2020-07-06 DIAGNOSIS — R2689 Other abnormalities of gait and mobility: Secondary | ICD-10-CM | POA: Diagnosis not present

## 2020-07-06 DIAGNOSIS — M5442 Lumbago with sciatica, left side: Secondary | ICD-10-CM | POA: Diagnosis not present

## 2020-07-10 DIAGNOSIS — R2689 Other abnormalities of gait and mobility: Secondary | ICD-10-CM | POA: Diagnosis not present

## 2020-07-10 DIAGNOSIS — M5432 Sciatica, left side: Secondary | ICD-10-CM | POA: Diagnosis not present

## 2020-07-10 DIAGNOSIS — M5442 Lumbago with sciatica, left side: Secondary | ICD-10-CM | POA: Diagnosis not present

## 2020-07-14 DIAGNOSIS — M5432 Sciatica, left side: Secondary | ICD-10-CM | POA: Diagnosis not present

## 2020-07-14 DIAGNOSIS — R2689 Other abnormalities of gait and mobility: Secondary | ICD-10-CM | POA: Diagnosis not present

## 2020-07-14 DIAGNOSIS — M5442 Lumbago with sciatica, left side: Secondary | ICD-10-CM | POA: Diagnosis not present

## 2020-07-18 DIAGNOSIS — R2689 Other abnormalities of gait and mobility: Secondary | ICD-10-CM | POA: Diagnosis not present

## 2020-07-18 DIAGNOSIS — M5442 Lumbago with sciatica, left side: Secondary | ICD-10-CM | POA: Diagnosis not present

## 2020-07-18 DIAGNOSIS — M5432 Sciatica, left side: Secondary | ICD-10-CM | POA: Diagnosis not present

## 2020-08-28 ENCOUNTER — Other Ambulatory Visit: Payer: Self-pay

## 2020-08-28 ENCOUNTER — Ambulatory Visit
Admission: RE | Admit: 2020-08-28 | Discharge: 2020-08-28 | Disposition: A | Discharge status: Home / Self Care | Payer: Medicare Other | Source: Ambulatory Visit | Attending: Internal Medicine | Admitting: Internal Medicine

## 2020-08-28 ENCOUNTER — Other Ambulatory Visit: Payer: Self-pay | Admitting: Internal Medicine

## 2020-08-28 DIAGNOSIS — R479 Unspecified speech disturbances: Secondary | ICD-10-CM

## 2020-08-28 DIAGNOSIS — G459 Transient cerebral ischemic attack, unspecified: Secondary | ICD-10-CM | POA: Diagnosis not present

## 2020-08-28 DIAGNOSIS — R32 Unspecified urinary incontinence: Secondary | ICD-10-CM | POA: Diagnosis not present

## 2020-08-30 DIAGNOSIS — R21 Rash and other nonspecific skin eruption: Secondary | ICD-10-CM | POA: Diagnosis not present

## 2020-09-02 DIAGNOSIS — N3281 Overactive bladder: Secondary | ICD-10-CM | POA: Diagnosis not present

## 2020-09-02 DIAGNOSIS — I10 Essential (primary) hypertension: Secondary | ICD-10-CM | POA: Diagnosis not present

## 2020-09-02 DIAGNOSIS — M519 Unspecified thoracic, thoracolumbar and lumbosacral intervertebral disc disorder: Secondary | ICD-10-CM | POA: Diagnosis not present

## 2020-09-02 DIAGNOSIS — H3530 Unspecified macular degeneration: Secondary | ICD-10-CM | POA: Diagnosis not present

## 2020-09-02 DIAGNOSIS — F419 Anxiety disorder, unspecified: Secondary | ICD-10-CM | POA: Diagnosis not present

## 2020-09-02 DIAGNOSIS — K589 Irritable bowel syndrome without diarrhea: Secondary | ICD-10-CM | POA: Diagnosis not present

## 2020-09-02 DIAGNOSIS — K219 Gastro-esophageal reflux disease without esophagitis: Secondary | ICD-10-CM | POA: Diagnosis not present

## 2020-09-02 DIAGNOSIS — M810 Age-related osteoporosis without current pathological fracture: Secondary | ICD-10-CM | POA: Diagnosis not present

## 2020-09-18 DIAGNOSIS — S90119A Contusion of unspecified great toe without damage to nail, initial encounter: Secondary | ICD-10-CM | POA: Diagnosis not present

## 2020-09-18 DIAGNOSIS — M79675 Pain in left toe(s): Secondary | ICD-10-CM | POA: Diagnosis not present

## 2020-09-26 DIAGNOSIS — D510 Vitamin B12 deficiency anemia due to intrinsic factor deficiency: Secondary | ICD-10-CM | POA: Diagnosis not present

## 2020-09-26 DIAGNOSIS — K219 Gastro-esophageal reflux disease without esophagitis: Secondary | ICD-10-CM | POA: Diagnosis not present

## 2020-09-26 DIAGNOSIS — F0390 Unspecified dementia without behavioral disturbance: Secondary | ICD-10-CM | POA: Diagnosis not present

## 2020-09-26 DIAGNOSIS — I10 Essential (primary) hypertension: Secondary | ICD-10-CM | POA: Diagnosis not present

## 2020-09-26 DIAGNOSIS — J309 Allergic rhinitis, unspecified: Secondary | ICD-10-CM | POA: Diagnosis not present

## 2020-10-06 DIAGNOSIS — I10 Essential (primary) hypertension: Secondary | ICD-10-CM | POA: Diagnosis not present

## 2020-10-06 DIAGNOSIS — F411 Generalized anxiety disorder: Secondary | ICD-10-CM | POA: Diagnosis not present

## 2020-10-06 DIAGNOSIS — R2689 Other abnormalities of gait and mobility: Secondary | ICD-10-CM | POA: Diagnosis not present

## 2020-10-06 DIAGNOSIS — K2100 Gastro-esophageal reflux disease with esophagitis, without bleeding: Secondary | ICD-10-CM | POA: Diagnosis not present

## 2020-10-06 DIAGNOSIS — R531 Weakness: Secondary | ICD-10-CM | POA: Diagnosis not present

## 2020-10-06 DIAGNOSIS — F0390 Unspecified dementia without behavioral disturbance: Secondary | ICD-10-CM | POA: Diagnosis not present

## 2020-10-06 DIAGNOSIS — M810 Age-related osteoporosis without current pathological fracture: Secondary | ICD-10-CM | POA: Diagnosis not present

## 2020-10-12 DIAGNOSIS — R2243 Localized swelling, mass and lump, lower limb, bilateral: Secondary | ICD-10-CM | POA: Diagnosis not present

## 2020-10-12 DIAGNOSIS — R309 Painful micturition, unspecified: Secondary | ICD-10-CM | POA: Diagnosis not present

## 2020-10-12 DIAGNOSIS — I10 Essential (primary) hypertension: Secondary | ICD-10-CM | POA: Diagnosis not present

## 2020-10-16 DIAGNOSIS — N390 Urinary tract infection, site not specified: Secondary | ICD-10-CM | POA: Diagnosis not present

## 2020-10-17 DIAGNOSIS — N390 Urinary tract infection, site not specified: Secondary | ICD-10-CM | POA: Diagnosis not present

## 2020-10-17 DIAGNOSIS — Z79899 Other long term (current) drug therapy: Secondary | ICD-10-CM | POA: Diagnosis not present

## 2020-10-17 DIAGNOSIS — R609 Edema, unspecified: Secondary | ICD-10-CM | POA: Diagnosis not present

## 2020-10-17 DIAGNOSIS — F0390 Unspecified dementia without behavioral disturbance: Secondary | ICD-10-CM | POA: Diagnosis not present

## 2020-11-10 DIAGNOSIS — R238 Other skin changes: Secondary | ICD-10-CM | POA: Diagnosis not present

## 2020-11-10 DIAGNOSIS — R609 Edema, unspecified: Secondary | ICD-10-CM | POA: Diagnosis not present

## 2020-11-10 DIAGNOSIS — F0390 Unspecified dementia without behavioral disturbance: Secondary | ICD-10-CM | POA: Diagnosis not present

## 2020-11-13 DIAGNOSIS — Z79899 Other long term (current) drug therapy: Secondary | ICD-10-CM | POA: Diagnosis not present

## 2020-11-13 DIAGNOSIS — Z5181 Encounter for therapeutic drug level monitoring: Secondary | ICD-10-CM | POA: Diagnosis not present

## 2020-12-25 DIAGNOSIS — F0390 Unspecified dementia without behavioral disturbance: Secondary | ICD-10-CM | POA: Diagnosis not present

## 2020-12-25 DIAGNOSIS — M545 Low back pain, unspecified: Secondary | ICD-10-CM | POA: Diagnosis not present

## 2020-12-25 DIAGNOSIS — F411 Generalized anxiety disorder: Secondary | ICD-10-CM | POA: Diagnosis not present

## 2020-12-25 DIAGNOSIS — W19XXXA Unspecified fall, initial encounter: Secondary | ICD-10-CM | POA: Diagnosis not present

## 2020-12-26 DIAGNOSIS — Z79899 Other long term (current) drug therapy: Secondary | ICD-10-CM | POA: Diagnosis not present

## 2020-12-26 DIAGNOSIS — R531 Weakness: Secondary | ICD-10-CM | POA: Diagnosis not present

## 2020-12-26 DIAGNOSIS — M5136 Other intervertebral disc degeneration, lumbar region: Secondary | ICD-10-CM | POA: Diagnosis not present

## 2020-12-26 DIAGNOSIS — M533 Sacrococcygeal disorders, not elsewhere classified: Secondary | ICD-10-CM | POA: Diagnosis not present

## 2020-12-27 DIAGNOSIS — W19XXXD Unspecified fall, subsequent encounter: Secondary | ICD-10-CM | POA: Diagnosis not present

## 2020-12-27 DIAGNOSIS — L89152 Pressure ulcer of sacral region, stage 2: Secondary | ICD-10-CM | POA: Diagnosis not present

## 2020-12-27 DIAGNOSIS — F411 Generalized anxiety disorder: Secondary | ICD-10-CM | POA: Diagnosis not present

## 2020-12-27 DIAGNOSIS — F0390 Unspecified dementia without behavioral disturbance: Secondary | ICD-10-CM | POA: Diagnosis not present

## 2020-12-27 DIAGNOSIS — M545 Low back pain, unspecified: Secondary | ICD-10-CM | POA: Diagnosis not present

## 2020-12-29 DIAGNOSIS — W19XXXD Unspecified fall, subsequent encounter: Secondary | ICD-10-CM | POA: Diagnosis not present

## 2020-12-29 DIAGNOSIS — M545 Low back pain, unspecified: Secondary | ICD-10-CM | POA: Diagnosis not present

## 2020-12-29 DIAGNOSIS — F0390 Unspecified dementia without behavioral disturbance: Secondary | ICD-10-CM | POA: Diagnosis not present

## 2021-01-05 DIAGNOSIS — M545 Low back pain, unspecified: Secondary | ICD-10-CM | POA: Diagnosis not present

## 2021-01-05 DIAGNOSIS — F0390 Unspecified dementia without behavioral disturbance: Secondary | ICD-10-CM | POA: Diagnosis not present

## 2021-01-05 DIAGNOSIS — W19XXXD Unspecified fall, subsequent encounter: Secondary | ICD-10-CM | POA: Diagnosis not present

## 2021-01-05 DIAGNOSIS — R296 Repeated falls: Secondary | ICD-10-CM | POA: Diagnosis not present

## 2021-01-06 DIAGNOSIS — M4856XA Collapsed vertebra, not elsewhere classified, lumbar region, initial encounter for fracture: Secondary | ICD-10-CM | POA: Diagnosis not present

## 2021-01-06 DIAGNOSIS — M47816 Spondylosis without myelopathy or radiculopathy, lumbar region: Secondary | ICD-10-CM | POA: Diagnosis not present

## 2021-01-12 DIAGNOSIS — R1084 Generalized abdominal pain: Secondary | ICD-10-CM | POA: Diagnosis not present

## 2021-01-13 ENCOUNTER — Encounter (HOSPITAL_COMMUNITY): Payer: Self-pay

## 2021-01-13 ENCOUNTER — Other Ambulatory Visit: Payer: Self-pay

## 2021-01-13 ENCOUNTER — Emergency Department (HOSPITAL_COMMUNITY)
Admission: EM | Admit: 2021-01-13 | Discharge: 2021-01-13 | Disposition: A | Discharge status: Home / Self Care | Payer: Medicare Other | Attending: Emergency Medicine | Admitting: Emergency Medicine

## 2021-01-13 ENCOUNTER — Emergency Department (HOSPITAL_COMMUNITY): Payer: Medicare Other

## 2021-01-13 DIAGNOSIS — S32502A Unspecified fracture of left pubis, initial encounter for closed fracture: Secondary | ICD-10-CM | POA: Insufficient documentation

## 2021-01-13 DIAGNOSIS — F0390 Unspecified dementia without behavioral disturbance: Secondary | ICD-10-CM | POA: Diagnosis not present

## 2021-01-13 DIAGNOSIS — I10 Essential (primary) hypertension: Secondary | ICD-10-CM | POA: Insufficient documentation

## 2021-01-13 DIAGNOSIS — R0902 Hypoxemia: Secondary | ICD-10-CM | POA: Diagnosis not present

## 2021-01-13 DIAGNOSIS — W19XXXA Unspecified fall, initial encounter: Secondary | ICD-10-CM | POA: Insufficient documentation

## 2021-01-13 DIAGNOSIS — Z79899 Other long term (current) drug therapy: Secondary | ICD-10-CM | POA: Diagnosis not present

## 2021-01-13 DIAGNOSIS — L8995 Pressure ulcer of unspecified site, unstageable: Secondary | ICD-10-CM | POA: Diagnosis not present

## 2021-01-13 DIAGNOSIS — S32512A Fracture of superior rim of left pubis, initial encounter for closed fracture: Secondary | ICD-10-CM

## 2021-01-13 DIAGNOSIS — S32592A Other specified fracture of left pubis, initial encounter for closed fracture: Secondary | ICD-10-CM

## 2021-01-13 DIAGNOSIS — S3993XA Unspecified injury of pelvis, initial encounter: Secondary | ICD-10-CM | POA: Diagnosis present

## 2021-01-13 DIAGNOSIS — R404 Transient alteration of awareness: Secondary | ICD-10-CM | POA: Diagnosis not present

## 2021-01-13 DIAGNOSIS — W010XXA Fall on same level from slipping, tripping and stumbling without subsequent striking against object, initial encounter: Secondary | ICD-10-CM

## 2021-01-13 DIAGNOSIS — M79605 Pain in left leg: Secondary | ICD-10-CM | POA: Diagnosis not present

## 2021-01-13 MED ORDER — ACETAMINOPHEN 325 MG PO TABS
650.0000 mg | ORAL_TABLET | Freq: Once | ORAL | Status: AC
  Administered 2021-01-13: 650 mg via ORAL
  Filled 2021-01-13: qty 2

## 2021-01-13 NOTE — ED Notes (Signed)
Patient transported to X-ray 

## 2021-01-13 NOTE — ED Provider Notes (Signed)
[facility] EMERGENCY DEPARTMENT Provider Note   CSN: [id number] Arrival date & time: 01/13/21  1226     History Chief Complaint  Patient presents with  . Fall    Selena Farmer is a 87 y.o. female.  Patient presents from [facility] via EMS with report of fall early this AM. No noted LOC with fall, and no change in alteration of patient's baseline mental status since fall ?6 AM. Pt unable to describe what caused fall, hx of advanced dementia - level 5 caveat. No vomiting since fall. Pt denies specific area of pain, no headache, no neck/back pain, no chest pain or abd pain. Pt does c/o pain w rom bil hips. No anticoag use.   The history is provided by the patient and the EMS personnel. The history is limited by the condition of the patient.  Fall       Past Medical History:  Diagnosis Date  . Anxiety   . Arthritis   . Blepharitis   . Cataracts, bilateral   . Dry eyes   . GERD (gastroesophageal reflux disease)   . Hypertension     There are no problems to display for this patient.   Past Surgical History:  Procedure Laterality Date  . CARPAL TUNNEL RELEASE Right 10/31/2016   Procedure: RIGHT CARPAL TUNNEL RELEASE;  Surgeon: Leanora Cover, MD;  [facility];  Service: Orthopedics;  Laterality: Right;  . CATARACT EXTRACTION       OB History   No obstetric history on file.     No family history on file.  Social History   Tobacco Use  . Smoking status: Never Smoker  . Smokeless tobacco: Never Used  Substance Use Topics  . Alcohol use: Yes    Comment: occas  . Drug use: No    Home Medications Prior to Admission medications   Medication Sig Start Date End Date Taking? Authorizing Provider  beta carotene 30 MG capsule Take 30 mg by mouth daily.    [provider]  calcium carbonate 1250 MG capsule Take 1,250 mg by mouth 2 (two) times daily with a meal.    [provider]  chlordiazePOXIDE (LIBRIUM) 5 MG  capsule Take 5 mg by mouth 3 (three) times daily as needed for anxiety.    [provider]  cholecalciferol (VITAMIN D) 1000 units tablet Take 1,000 Units by mouth daily.    [provider]  Cranberry 125 MG TABS Take by mouth.    [provider]  ferrous sulfate 325 (65 FE) MG tablet Take 325 mg by mouth daily with breakfast.    [provider]  glucosamine-chondroitin 500-400 MG tablet Take 1 tablet by mouth 3 (three) times daily.    [provider]  HYDROcodone-acetaminophen [facility]) 5-325 MG tablet 1-2 tabs po q6 hours prn pain 10/31/16   Leanora Cover, MD  Misc Natural Products (LUTEIN 20 PO) Take by mouth.    [provider]  Multiple Vitamins-Minerals (PRESERVISION AREDS 2+MULTI VIT PO) Take by mouth.    [provider]  nizatidine (AXID) 150 MG capsule Take 150 mg by mouth 2 (two) times daily.    [provider]  Omega-3 Fatty Acids (FISH OIL PO) Take by mouth.    [provider]  omeprazole (PRILOSEC) 20 MG capsule Take 20 mg by mouth daily.    [provider]  Polyethyl Glycol-Propyl Glycol 0.4-0.3 % SOLN Apply to eye.    [provider]  ramipril (  ALTACE) 10 MG capsule Take 10 mg by mouth daily.    [provider]  thiamine (VITAMIN B-1) 100 MG tablet Take 100 mg by mouth daily.    [provider]  vitamin B-12 (CYANOCOBALAMIN) 1000 MCG tablet Take 1,000 mcg by mouth daily.    [provider]    Allergies    Fexofenadine, Buspirone, Clindamycin/lincomycin, Codeine, Lansoprazole, Pantoprazole, Propranolol, Raloxifene, Sulfamethoxazole, and Toprol xl [metoprolol tartrate]  Review of Systems   Review of Systems  Unable to perform ROS: Dementia  level 5 caveat - dementia   Physical Exam Updated Vital Signs SpO2 94%   Physical Exam Vitals and nursing note reviewed.  Constitutional:      Appearance: Normal appearance. She is well-developed.  HENT:      Head: Atraumatic.     Comments: No head, face or scalp sts, contusion, bruising or tenderness.     Nose: Nose normal.     Mouth/Throat:     Mouth: Mucous membranes are moist.  Eyes:     General: No scleral icterus.    Conjunctiva/sclera: Conjunctivae normal.     Pupils: Pupils are equal, round, and reactive to light.  Neck:     Vascular: No carotid bruit.     Trachea: No tracheal deviation.     Comments: No pain w movement/rom.  Cardiovascular:     Rate and Rhythm: Normal rate and regular rhythm.     Pulses: Normal pulses.     Heart sounds: Normal heart sounds. No murmur heard. No friction rub. No gallop.   Pulmonary:     Effort: Pulmonary effort is normal. No respiratory distress.     Breath sounds: Normal breath sounds.  Chest:     Chest wall: No tenderness.  Abdominal:     General: Bowel sounds are normal. There is no distension.     Palpations: Abdomen is soft.     Tenderness: There is no abdominal tenderness. There is no guarding.     Comments: No abd contusion or bruising.   Genitourinary:    Comments: No cva tenderness.  Musculoskeletal:        General: No swelling.     Cervical back: Normal range of motion and neck supple. No rigidity or tenderness. No muscular tenderness.     Comments: CTLS spine, non tender, aligned, no step off. ?pain w rom bil hips, otherwise no focal bony tenderness or swelling on bilateral extremity exam. Distal pulses palp bil upper and lower extremities.   Skin:    General: Skin is warm and dry.     Findings: No rash.  Neurological:     Mental Status: She is alert.     Comments: Alert, speech not grossly dysarthric or aphasic. Oriented to person. Confusion/dementia - patients mental status reported as being c/w baseline. Moves bil extremities purposefully. Motor/sens appears grossly intact bil.   Psychiatric:        Mood and Affect: Mood normal.     ED Results / Procedures / Treatments   Labs (all labs ordered are listed, but only  abnormal results are displayed) Labs Reviewed - No data to display  EKG None  Radiology DG HIP UNILAT W OR W/O PELVIS 2-3 VIEWS LEFT  Result Date: 01/13/2021 CLINICAL DATA:  Pain after fall EXAM: DG HIP (WITH OR WITHOUT PELVIS) 2-3V LEFT COMPARISON:  None. FINDINGS: There is no evidence of hip fracture or dislocation. There is no evidence of arthropathy or other focal bone abnormality. IMPRESSION: Negative. Electronically Signed  By: Dorise Bullion III M.D   On: 01/13/2021 13:24   DG HIP UNILAT W OR W/O PELVIS 2-3 VIEWS RIGHT  Result Date: 01/13/2021 CLINICAL DATA:  Pain after fall EXAM: DG HIP (WITH OR WITHOUT PELVIS) 2-3V RIGHT COMPARISON:  None. FINDINGS: No right hip fracture. No left hip fracture identified. Nondisplaced fractures are seen through the left superior and inferior pubic rami. No other abnormalities. IMPRESSION: Nondisplaced fractures are seen through the left superior and inferior pubic rami. Electronically Signed   By: Dorise Bullion III M.D   On: 01/13/2021 13:25    Procedures Procedures   Medications Ordered in ED Medications - No data to display  ED Course  I have reviewed the triage vital signs and the nursing notes.  Pertinent labs & imaging results that were available during my care of the patient were reviewed by me and considered in my medical decision making (see chart for details).    MDM Rules/Calculators/A&P                          Imaging ordered.   Reviewed nursing notes and prior charts for additional history.  Pt has DNR/MOST form with her - it specifies DNR, no intubation, no CPR and also specifies no antibiotics, no ivf.   Xrays reviewed/interpreted by me - no hip fx. +left public ramus fractures.   Acetaminophen po.   Recheck, alert, content, no apparent pain, discomfort or distress - pt appears stable for d/c.    Final Clinical Impression(s) / ED Diagnoses Final diagnoses:  None    Rx / DC Orders ED Discharge Orders    None        Lajean Saver, MD 01/13/21 1339

## 2021-01-13 NOTE — ED Notes (Signed)
Attempted to call report to facility, [facility], no one answered. This RN left a detailed message with a call back number for the facility. Transport has been arranged for the pt.

## 2021-01-13 NOTE — ED Notes (Signed)
Pt's daughter stated that they have been waiting for [facility] for 6 hours and would like to go ahead and go home.

## 2021-01-13 NOTE — Discharge Instructions (Addendum)
It was our pleasure to provide your ER care today - we hope that you feel better.  Your xrays show no hip fracture, however, left pubic rami fractures are noted.   Take acetaminophen as need for pain.  You may also take your tramadol as need for pain.   You may weight bear as tolerated.   Fall precautions, use walker and assistance when up/trying to walk.  Return to ER if worse, new symptoms, fevers, trouble breathing, severe or intractable pain, or other concern.

## 2021-01-13 NOTE — ED Notes (Signed)
RN and tech dressed pt and returned belongings. Discharge instructions reviewed w/ daughter. Denied question or concerns. Primary RN called report to facility, no on answered message was left w/ return phone number. DNR was returned w/ pt.  placed pt in wheelchair. Pt tolerated well. Left w/ daughter. NAD noted. VSS.

## 2021-01-13 NOTE — ED Triage Notes (Signed)
Pt BIB [facility] from [facility] c/o an unwitnessed fall this morning around 6am. Pt has a hx of dementia and is alert to self at baseline. Pt is a DNR. Pt c/o of pain all over at this time. 5/10 pain. Pt did have CT scan of abdomen and back yesterday related to pt c/o of lower abdominal pain and lower back pain. Pt normally walks with a walker unable to walk on left leg since fall this AM.

## 2021-01-14 DIAGNOSIS — N390 Urinary tract infection, site not specified: Secondary | ICD-10-CM | POA: Diagnosis not present

## 2021-01-15 DIAGNOSIS — S32592D Other specified fracture of left pubis, subsequent encounter for fracture with routine healing: Secondary | ICD-10-CM | POA: Diagnosis not present

## 2021-01-15 DIAGNOSIS — F0390 Unspecified dementia without behavioral disturbance: Secondary | ICD-10-CM | POA: Diagnosis not present

## 2021-01-15 DIAGNOSIS — W19XXXD Unspecified fall, subsequent encounter: Secondary | ICD-10-CM | POA: Diagnosis not present

## 2021-01-15 DIAGNOSIS — M545 Low back pain, unspecified: Secondary | ICD-10-CM | POA: Diagnosis not present

## 2021-01-19 DIAGNOSIS — W19XXXD Unspecified fall, subsequent encounter: Secondary | ICD-10-CM | POA: Diagnosis not present

## 2021-01-19 DIAGNOSIS — F0390 Unspecified dementia without behavioral disturbance: Secondary | ICD-10-CM | POA: Diagnosis not present

## 2021-01-19 DIAGNOSIS — S32592D Other specified fracture of left pubis, subsequent encounter for fracture with routine healing: Secondary | ICD-10-CM | POA: Diagnosis not present

## 2021-01-19 DIAGNOSIS — M545 Low back pain, unspecified: Secondary | ICD-10-CM | POA: Diagnosis not present

## 2021-02-07 DIAGNOSIS — M533 Sacrococcygeal disorders, not elsewhere classified: Secondary | ICD-10-CM | POA: Diagnosis not present

## 2022-10-24 DIAGNOSIS — Z6820 Body mass index (BMI) 20.0-20.9, adult: Secondary | ICD-10-CM

## 2022-10-24 DIAGNOSIS — K5900 Constipation, unspecified: Secondary | ICD-10-CM

## 2022-10-24 DIAGNOSIS — R52 Pain, unspecified: Secondary | ICD-10-CM | POA: Diagnosis not present

## 2022-10-24 DIAGNOSIS — F0394 Unspecified dementia, unspecified severity, with anxiety: Secondary | ICD-10-CM

## 2022-10-24 DIAGNOSIS — H3530 Unspecified macular degeneration: Secondary | ICD-10-CM

## 2022-10-24 DIAGNOSIS — I10 Essential (primary) hypertension: Secondary | ICD-10-CM | POA: Diagnosis not present

## 2022-10-24 DIAGNOSIS — G4700 Insomnia, unspecified: Secondary | ICD-10-CM | POA: Diagnosis not present

## 2022-10-24 DIAGNOSIS — N3281 Overactive bladder: Secondary | ICD-10-CM | POA: Diagnosis not present

## 2022-11-12 DIAGNOSIS — B372 Candidiasis of skin and nail: Secondary | ICD-10-CM | POA: Diagnosis not present

## 2022-11-12 DIAGNOSIS — F0394 Unspecified dementia, unspecified severity, with anxiety: Secondary | ICD-10-CM | POA: Diagnosis not present

## 2022-11-24 IMAGING — CR DG HIP (WITH OR WITHOUT PELVIS) 2-3V*R*
3 series · 3 of 3 positions shown · non-contrast
Comparison: None.

CLINICAL DATA: Pain after fall

EXAM:
DG HIP (WITH OR WITHOUT PELVIS) 2-3V RIGHT

[pelvis ap]
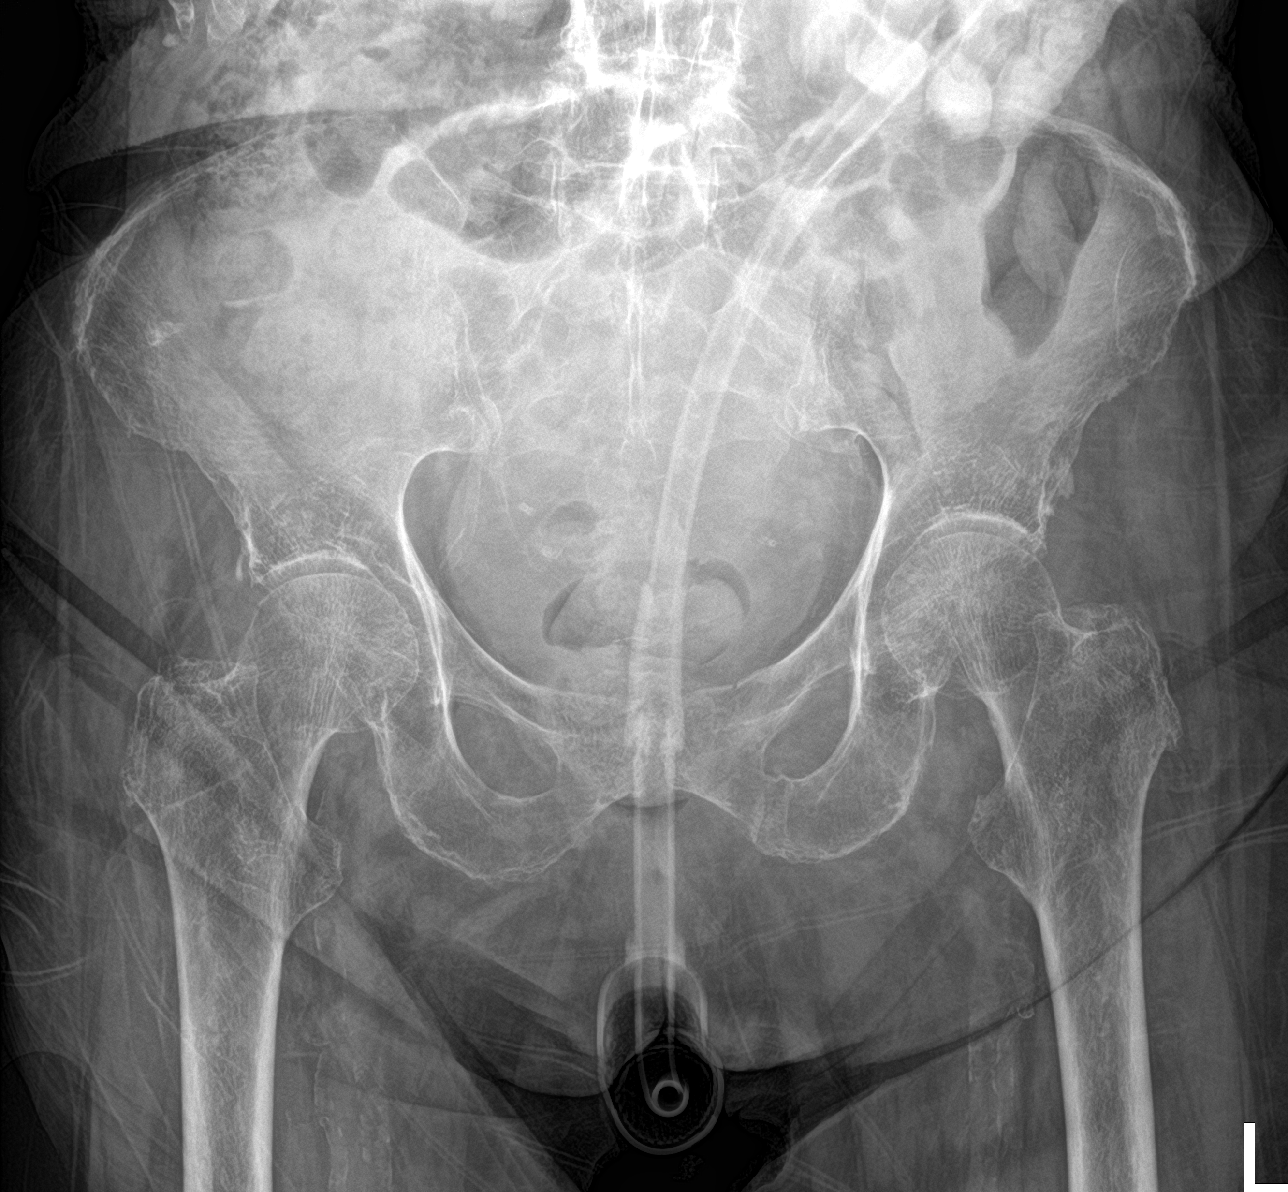

[hip ap]
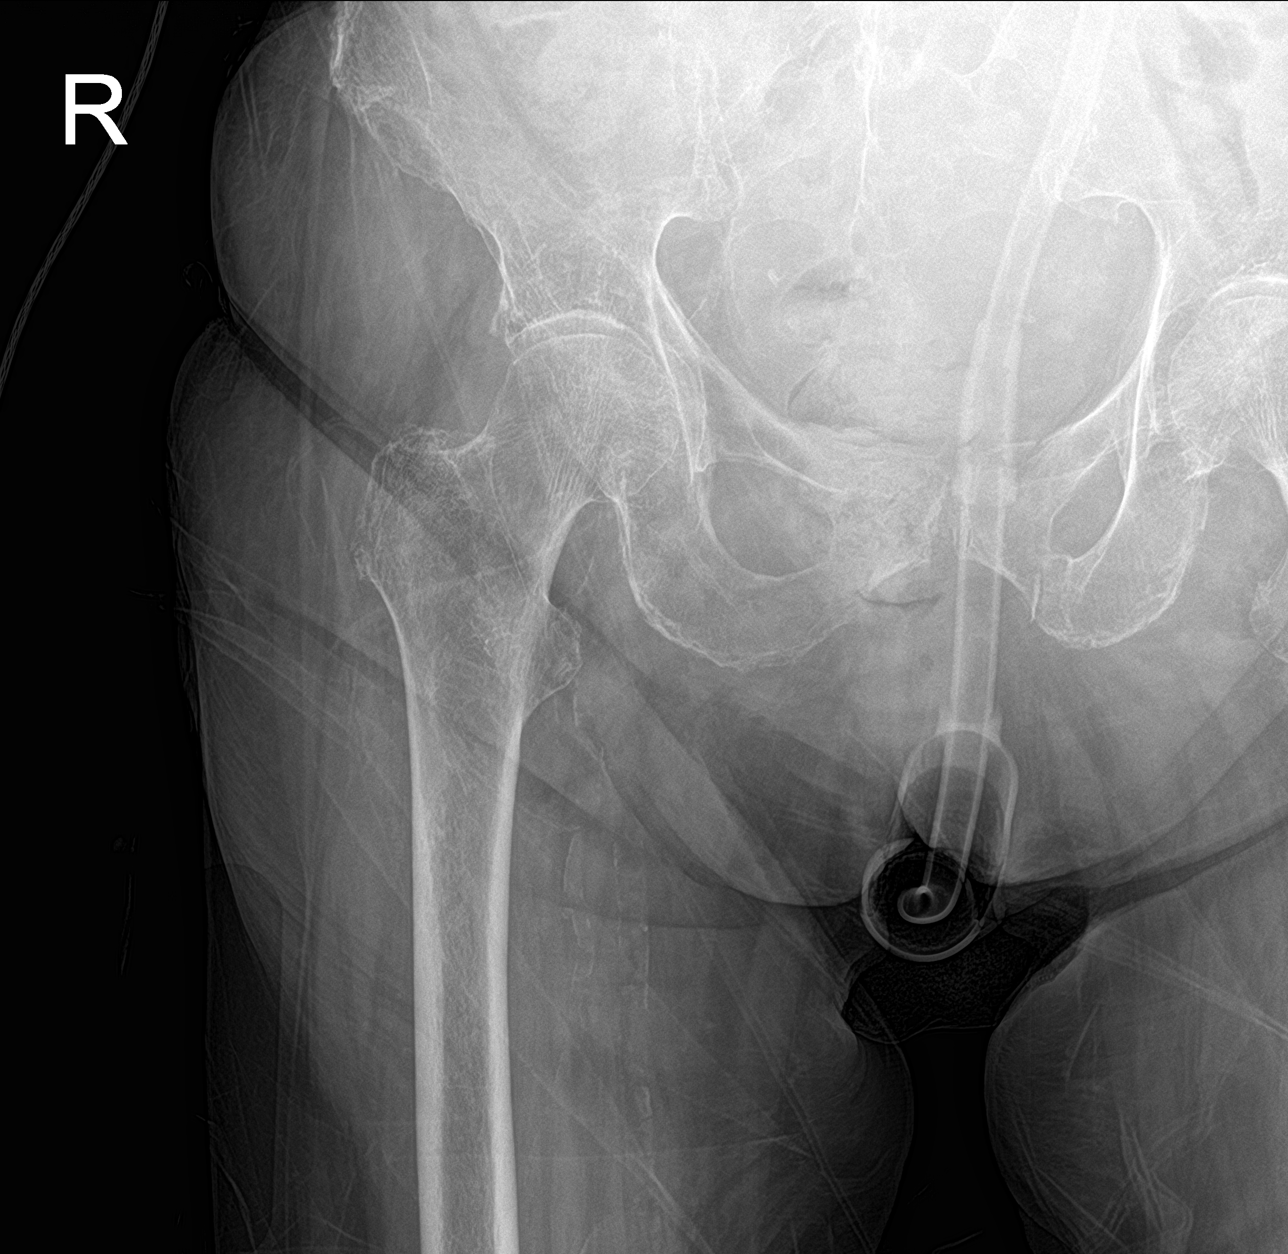

[hip lat]
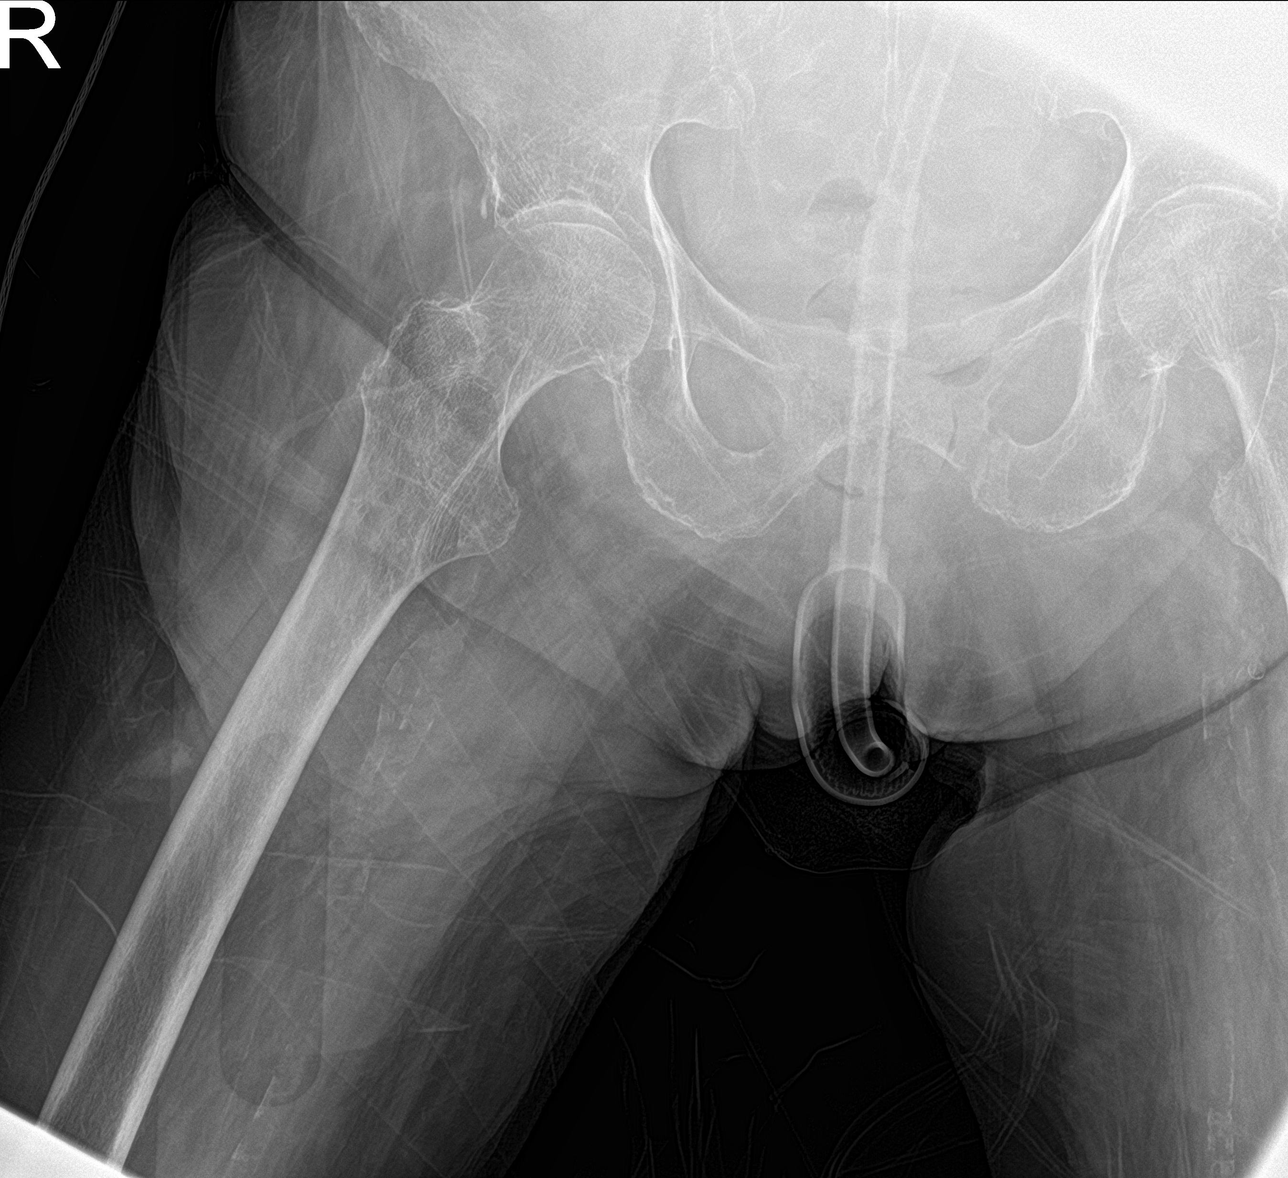

[3 of 3 positions shown; findings below may reference images not displayed]

FINDINGS: No right hip fracture. No left hip fracture identified. Nondisplaced
fractures are seen through the left superior and inferior pubic
rami. No other abnormalities.
IMPRESSION: Nondisplaced fractures are seen through the left superior and
inferior pubic rami.

## 2023-05-16 ENCOUNTER — Non-Acute Institutional Stay: Payer: Self-pay | Admitting: Internal Medicine

## 2023-05-16 DIAGNOSIS — I10 Essential (primary) hypertension: Secondary | ICD-10-CM | POA: Diagnosis not present

## 2023-05-16 DIAGNOSIS — G894 Chronic pain syndrome: Secondary | ICD-10-CM

## 2023-05-16 DIAGNOSIS — F0390 Unspecified dementia without behavioral disturbance: Secondary | ICD-10-CM | POA: Diagnosis not present

## 2023-05-16 DIAGNOSIS — R350 Frequency of micturition: Secondary | ICD-10-CM | POA: Diagnosis not present

## 2023-05-16 DIAGNOSIS — F419 Anxiety disorder, unspecified: Secondary | ICD-10-CM | POA: Diagnosis not present

## 2023-05-16 NOTE — Progress Notes (Unsigned)
   Office Visit  Subjective   Patient ID: Selena Farmer   DOB: 1923/12/18   Age: 89 y.o.   MRN: [id number]   Chief Complaint No chief complaint on file.    History of Present Illness HPI   Past Medical History Past Medical History:  Diagnosis Date   Anxiety    Arthritis    Blepharitis    Cataracts, bilateral    Dry eyes    GERD (gastroesophageal reflux disease)    Hypertension      Allergies Allergies  Allergen Reactions   Fexofenadine Shortness Of Breath   Conj Estrog-Medroxyprogest Ace     Other reaction(s): Unknown   Fosamax [Alendronate] Other (See Comments)   Hydrochlorothiazide     Other reaction(s): weakness   Meperidine Hcl     Other reaction(s): Unknown   Omeprazole    Buspirone Nausea And Vomiting   Clindamycin/Lincomycin Diarrhea   Codeine Rash   Lansoprazole Diarrhea   Pantoprazole Diarrhea   Propranolol Diarrhea   Raloxifene Other (See Comments)    weakness   Sulfamethoxazole Nausea And Vomiting   Toprol Xl [Metoprolol Tartrate] Other (See Comments)    Leg cramps     Review of Systems ROS     Objective:    Vitals There were no vitals taken for this visit.   Physical Examination Physical Exam     Assessment & Plan:   No problem-specific Assessment & Plan notes found for this encounter.    No follow-ups on file.   Eloisa Northern, MD

## 2023-05-19 ENCOUNTER — Encounter: Payer: Self-pay | Admitting: Internal Medicine

## 2023-06-17 ENCOUNTER — Encounter: Payer: Self-pay | Admitting: Internal Medicine

## 2023-06-17 ENCOUNTER — Non-Acute Institutional Stay: Payer: Medicare Other | Admitting: Internal Medicine

## 2023-06-17 DIAGNOSIS — W19XXXA Unspecified fall, initial encounter: Secondary | ICD-10-CM | POA: Diagnosis not present

## 2023-06-17 DIAGNOSIS — R000 Tachycardia, unspecified: Secondary | ICD-10-CM | POA: Diagnosis not present

## 2023-06-17 DIAGNOSIS — S0990XA Unspecified injury of head, initial encounter: Secondary | ICD-10-CM | POA: Diagnosis not present

## 2023-06-17 NOTE — Assessment & Plan Note (Signed)
She has tachycardia but does not look in any distress.

## 2023-06-17 NOTE — Progress Notes (Signed)
   Acute Office Visit  Subjective:     Patient ID: Selena Farmer, female    DOB: June 14, 1924, 89 y.o.   MRN: [id number]  No chief complaint on file.   HPI Patient is 89 years old female who was seen at the request of staff at assisted [facility].  She is a resident of her dementia unit.  Staff noted that patient has a bruise on her back head and also in her left hand.  There is no witnessed fall. patient seems as pleasant and talkative as she normally is does not look in any distress.  No change in her behavior.  She is getting Norco for pain and Ativan for anxiety.  Review of Systems  Constitutional: Negative.   Musculoskeletal: Negative.   Skin:        Bruise back of head and left hand.        Objective:    There were no vitals taken for this visit.   Physical Exam Constitutional:      Appearance: Normal appearance.  HENT:     Head:     Comments: Does back of her head. Cardiovascular:     Rate and Rhythm: Normal rate.     Heart sounds: Normal heart sounds.  Pulmonary:     Effort: Pulmonary effort is normal.     Breath sounds: Normal breath sounds.  Musculoskeletal:     Comments: She also has a mild bruise of her hand but no pain on movement and no tenderness.  Neurological:     Mental Status: She is alert.     No results found for any visits on 06/17/23.      Assessment & Plan:   Problem List Items Addressed This Visit       Other   Fall - Primary    She most likely has unwitnessed fall that staff is not aware of.  Staff will keep closer eye on her.      Head trauma    She has a bruise back of her head.  I have discussed with the staff to monitor her particularly her mental status.  If there is any change in the mental status then we will investigate otherwise we will continue to monitor.      Tachycardia    She has tachycardia but does not look in any distress.       No orders of the defined types were placed in this encounter.   No  follow-ups on file.  Eloisa Northern, MD

## 2023-06-17 NOTE — Assessment & Plan Note (Signed)
She most likely has unwitnessed fall that staff is not aware of.  Staff will keep closer eye on her.

## 2023-06-17 NOTE — Assessment & Plan Note (Signed)
She has a bruise back of her head.  I have discussed with the staff to monitor her particularly her mental status.  If there is any change in the mental status then we will investigate otherwise we will continue to monitor.

## 2023-08-19 DIAGNOSIS — S72001A Fracture of unspecified part of neck of right femur, initial encounter for closed fracture: Secondary | ICD-10-CM | POA: Diagnosis not present

## 2023-08-19 DIAGNOSIS — G301 Alzheimer's disease with late onset: Secondary | ICD-10-CM | POA: Diagnosis not present

## 2023-08-19 DIAGNOSIS — M810 Age-related osteoporosis without current pathological fracture: Secondary | ICD-10-CM | POA: Diagnosis not present

## 2023-09-17 DEATH — deceased
